# Patient Record
Sex: Female | Born: 1985 | Race: Black or African American | Hispanic: No | Marital: Single | Smoking: Former smoker
Health system: Southern US, Community
[De-identification: ages and names within clinical notes are randomized; demographics above are authoritative.]

## PROBLEM LIST (undated history)

## (undated) ENCOUNTER — Inpatient Hospital Stay (HOSPITAL_COMMUNITY): Payer: Self-pay

## (undated) DIAGNOSIS — R011 Cardiac murmur, unspecified: Secondary | ICD-10-CM

## (undated) DIAGNOSIS — Z8619 Personal history of other infectious and parasitic diseases: Secondary | ICD-10-CM

## (undated) HISTORY — PX: SKIN GRAFT: SHX250

## (undated) HISTORY — PX: OTHER SURGICAL HISTORY: SHX169

---
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
## 2008-02-03 ENCOUNTER — Inpatient Hospital Stay (HOSPITAL_COMMUNITY): Admission: AD | Admit: 2008-02-03 | Discharge: 2008-02-03 | Payer: Self-pay | Admitting: Obstetrics & Gynecology

## 2008-02-09 ENCOUNTER — Inpatient Hospital Stay (HOSPITAL_COMMUNITY): Admission: AD | Admit: 2008-02-09 | Discharge: 2008-02-09 | Payer: Self-pay | Admitting: Obstetrics and Gynecology

## 2008-02-12 ENCOUNTER — Inpatient Hospital Stay (HOSPITAL_COMMUNITY): Admission: AD | Admit: 2008-02-12 | Discharge: 2008-02-12 | Payer: Self-pay | Admitting: Obstetrics & Gynecology

## 2008-03-02 ENCOUNTER — Inpatient Hospital Stay (HOSPITAL_COMMUNITY): Admission: AD | Admit: 2008-03-02 | Discharge: 2008-03-02 | Payer: Self-pay | Admitting: Obstetrics & Gynecology

## 2008-03-03 ENCOUNTER — Inpatient Hospital Stay (HOSPITAL_COMMUNITY): Admission: AD | Admit: 2008-03-03 | Discharge: 2008-03-03 | Payer: Self-pay | Admitting: Family Medicine

## 2008-07-05 ENCOUNTER — Emergency Department (HOSPITAL_COMMUNITY): Admission: EM | Admit: 2008-07-05 | Discharge: 2008-07-05 | Payer: Self-pay | Admitting: Emergency Medicine

## 2009-01-20 ENCOUNTER — Emergency Department (HOSPITAL_COMMUNITY): Admission: EM | Admit: 2009-01-20 | Discharge: 2009-01-20 | Payer: Self-pay | Admitting: Emergency Medicine

## 2009-01-24 ENCOUNTER — Emergency Department (HOSPITAL_COMMUNITY): Admission: EM | Admit: 2009-01-24 | Discharge: 2009-01-24 | Payer: Self-pay | Admitting: Family Medicine

## 2009-02-14 ENCOUNTER — Ambulatory Visit: Payer: Self-pay | Admitting: Obstetrics & Gynecology

## 2009-02-14 ENCOUNTER — Encounter: Payer: Self-pay | Admitting: Obstetrics & Gynecology

## 2009-02-15 ENCOUNTER — Encounter: Payer: Self-pay | Admitting: Obstetrics & Gynecology

## 2009-02-15 LAB — CONVERTED CEMR LAB
Clue Cells Wet Prep HPF POC: NONE SEEN
Trich, Wet Prep: NONE SEEN
Yeast Wet Prep HPF POC: NONE SEEN

## 2009-07-30 ENCOUNTER — Inpatient Hospital Stay (HOSPITAL_COMMUNITY): Admission: AD | Admit: 2009-07-30 | Discharge: 2009-07-31 | Payer: Self-pay | Admitting: Obstetrics and Gynecology

## 2009-11-12 ENCOUNTER — Inpatient Hospital Stay (HOSPITAL_COMMUNITY): Admission: AD | Admit: 2009-11-12 | Discharge: 2009-11-12 | Payer: Self-pay | Admitting: Obstetrics and Gynecology

## 2009-11-12 ENCOUNTER — Inpatient Hospital Stay (HOSPITAL_COMMUNITY): Admission: AD | Admit: 2009-11-12 | Discharge: 2009-11-15 | Payer: Self-pay | Admitting: Obstetrics and Gynecology

## 2009-11-13 ENCOUNTER — Encounter (INDEPENDENT_AMBULATORY_CARE_PROVIDER_SITE_OTHER): Payer: Self-pay | Admitting: Obstetrics and Gynecology

## 2010-01-04 IMAGING — US US OB TRANSVAGINAL MODIFY
1 series · 14 of 28 positions shown · non-contrast
Comparison: none

CLINICAL DATA: 5 weeks spotting

OBSTETRIC <14 WK US AND TRANSVAGINAL OB US
TECHNIQUE: Both transabdominal and transvaginal ultrasound
examinations were performed for complete evaluation of the
gestation as well as the maternal uterus, adnexal regions, and
pelvic cul-de-sac.

[Series 1: us ob transvaginal modify · 0.28mm/px · 14 of 31 slices shown]
[im 2/31]
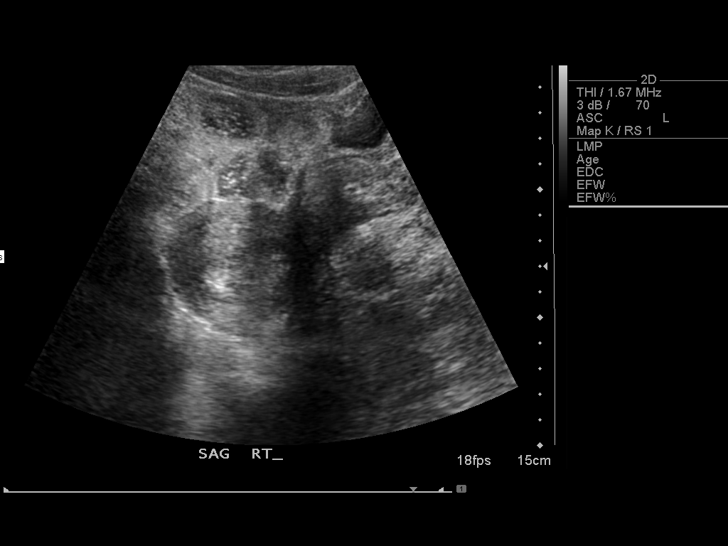
[im 4/31]
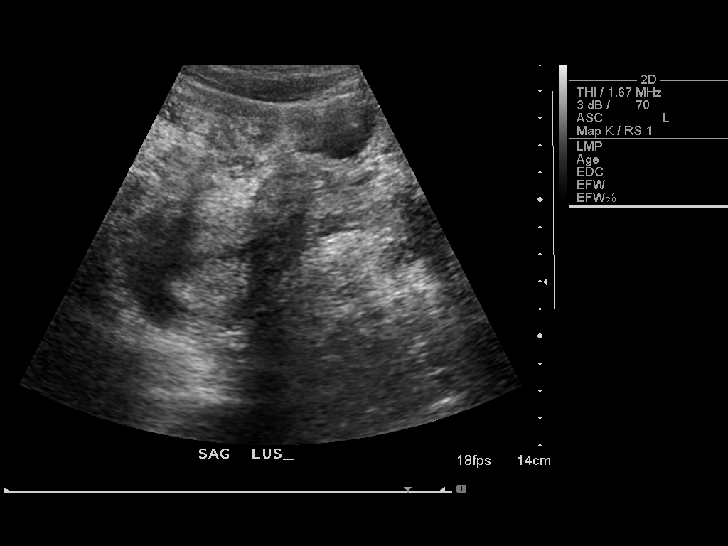
[im 6/31]
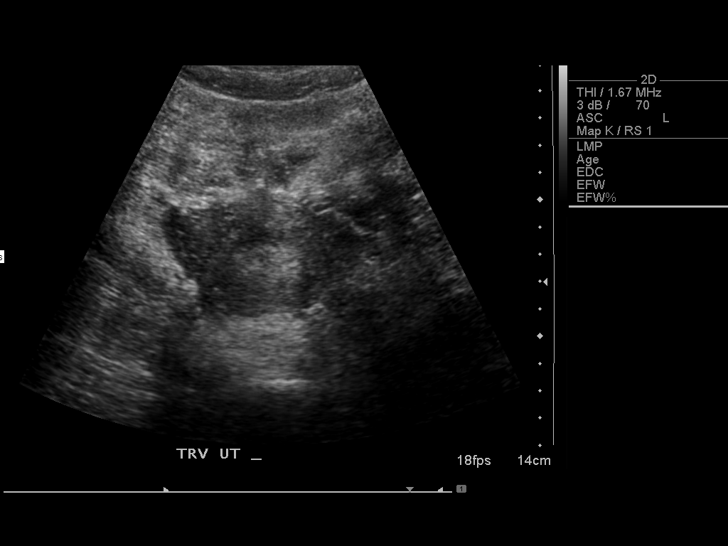
[im 8/31]
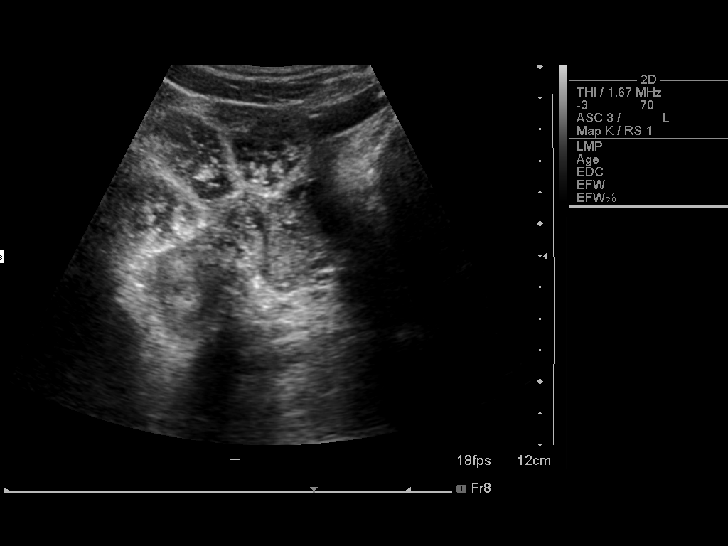
[im 11/31]
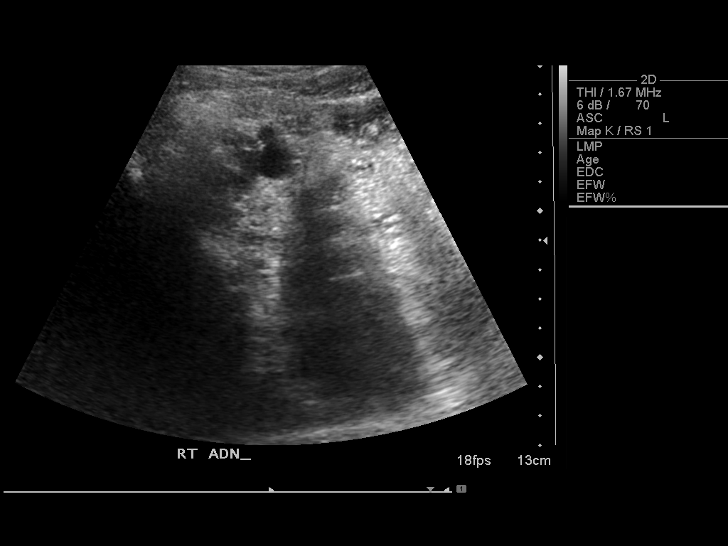
[im 13/31]
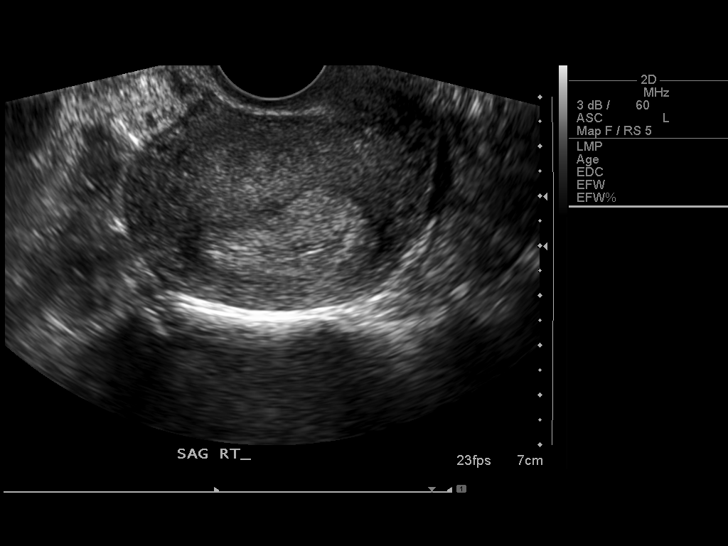
[im 15/31]
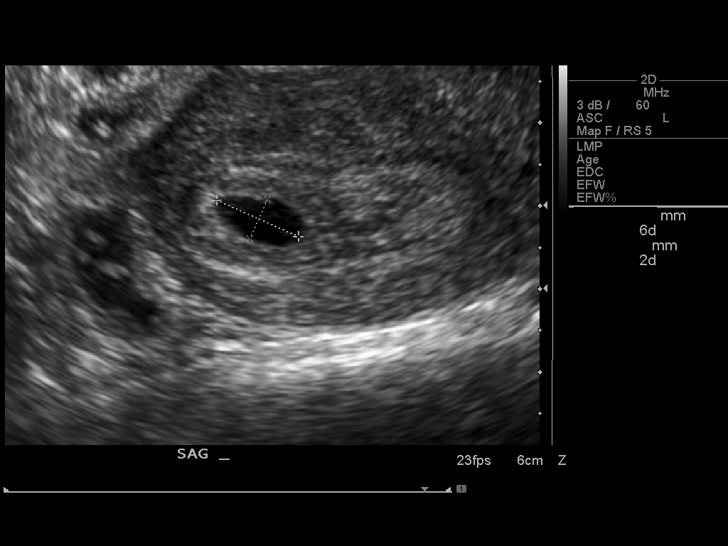
[im 17/31]
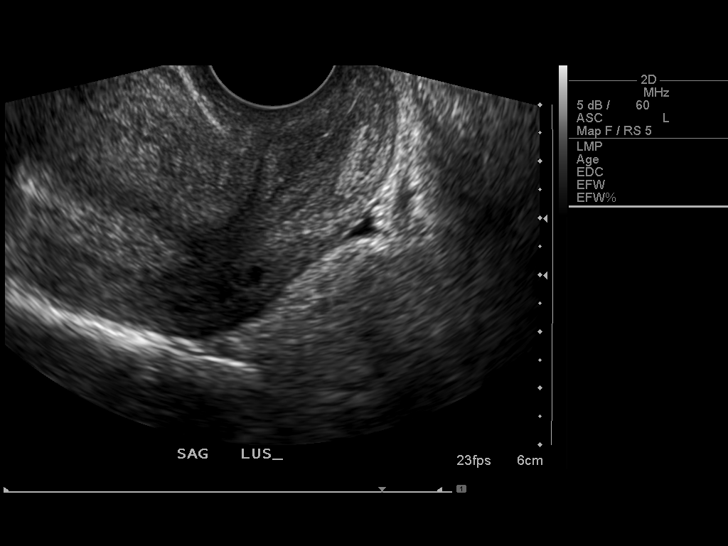
[im 19/31]
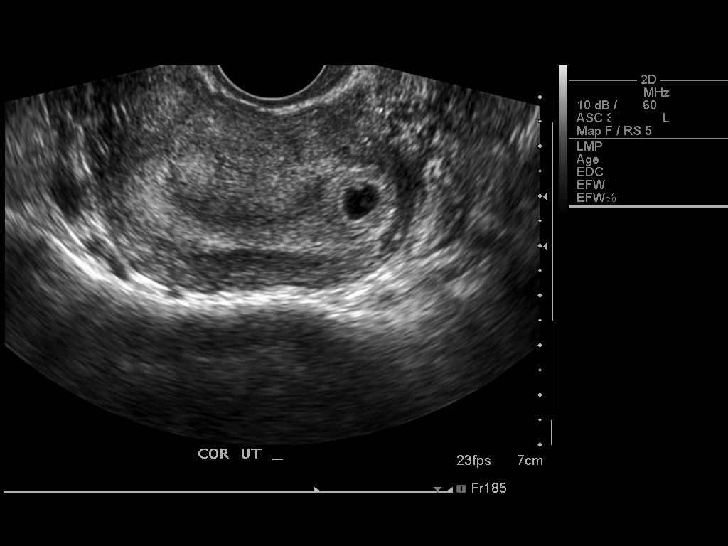
[im 22/31]
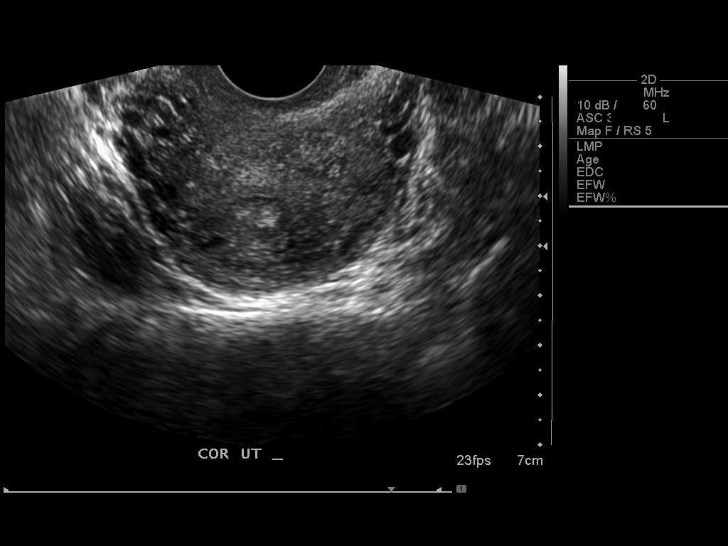
[im 24/31]
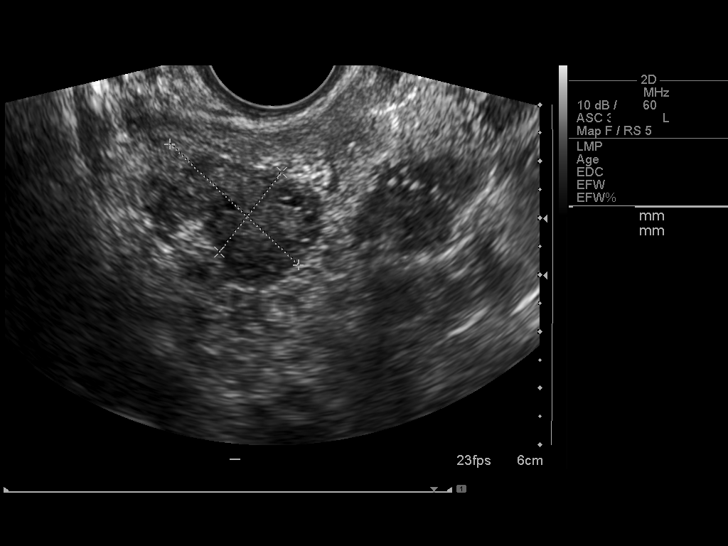
[im 26/31]
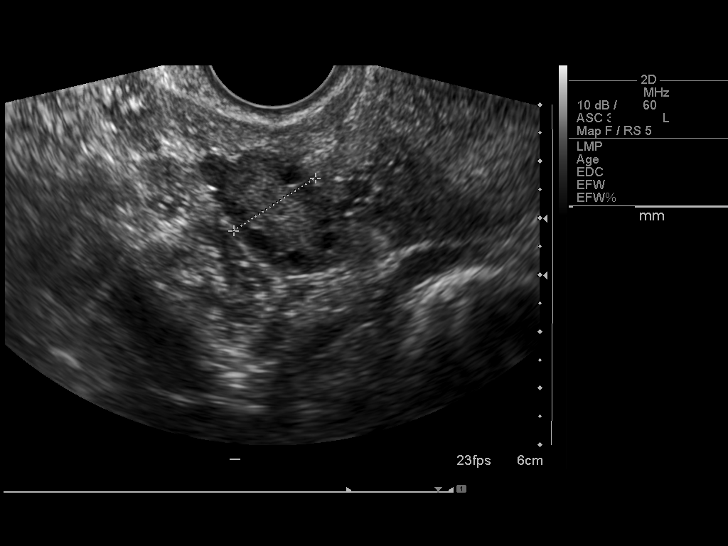
[im 28/31]
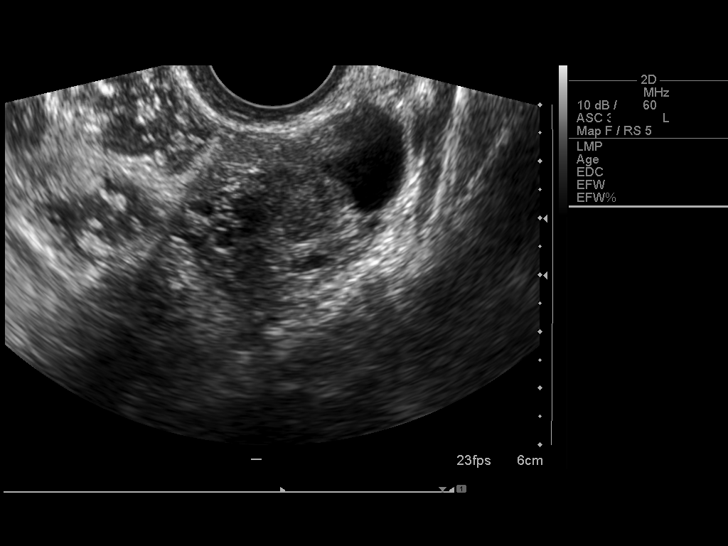
[im 31/31]
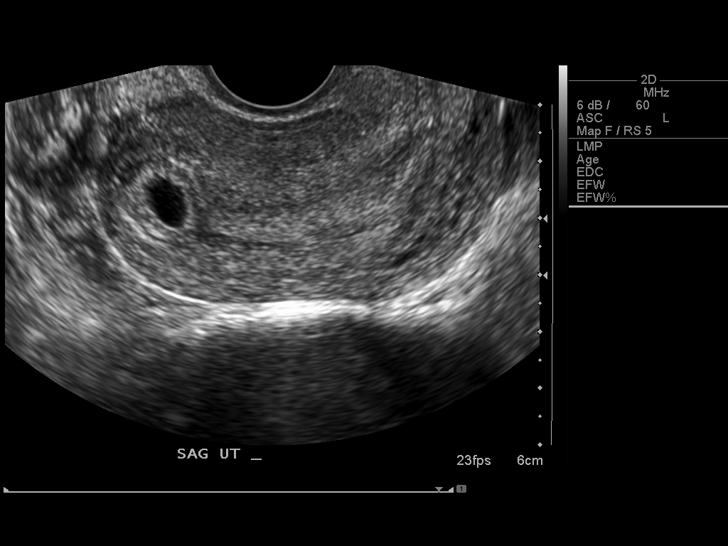

[14 of 28 positions shown; findings below may reference images not displayed]

FINDING: Single intrauterine gestational sac in the left aspect of
the uterine fundus with yolk sac present.  No embryo present.

MSD: 7.7 mm    5 weeks 4 days

Maternal uterus/adnexae:
Corpus luteal cyst in the left ovary.  Normal right ovary.  No
subchorionic hemorrhage.  No free fluid.
IMPRESSION: 1.  Single intrauterine gestational sac with yolk sac present most
consistent with early intrauterine pregnancy. Cannot completely
exclude spontaneous abortion in progress or ectopic pregnancy.
Recommend standard follow-up Beta HCG and imaging.

## 2010-09-24 ENCOUNTER — Inpatient Hospital Stay (HOSPITAL_COMMUNITY)
Admission: AD | Admit: 2010-09-24 | Discharge: 2010-09-24 | Payer: Self-pay | Source: Home / Self Care | Attending: Obstetrics and Gynecology | Admitting: Obstetrics and Gynecology

## 2010-12-23 LAB — URINALYSIS, ROUTINE W REFLEX MICROSCOPIC
Bilirubin Urine: NEGATIVE
Glucose, UA: NEGATIVE mg/dL
Hgb urine dipstick: NEGATIVE
Ketones, ur: NEGATIVE mg/dL
Specific Gravity, Urine: 1.015 (ref 1.005–1.030)
pH: 7 (ref 5.0–8.0)

## 2010-12-23 LAB — WET PREP, GENITAL: Trich, Wet Prep: NONE SEEN

## 2010-12-23 LAB — POCT PREGNANCY, URINE: Preg Test, Ur: NEGATIVE

## 2010-12-23 LAB — GC/CHLAMYDIA PROBE AMP, GENITAL
Chlamydia, DNA Probe: NEGATIVE
GC Probe Amp, Genital: NEGATIVE

## 2010-12-29 LAB — CBC
Platelets: 173 10*3/uL (ref 150–400)
RDW: 16.1 % — ABNORMAL HIGH (ref 11.5–15.5)
WBC: 11.4 10*3/uL — ABNORMAL HIGH (ref 4.0–10.5)

## 2010-12-29 LAB — RPR: RPR Ser Ql: NONREACTIVE

## 2011-01-01 LAB — CBC
Hemoglobin: 10.2 g/dL — ABNORMAL LOW (ref 12.0–15.0)
RBC: 3.32 MIL/uL — ABNORMAL LOW (ref 3.87–5.11)
WBC: 18.8 10*3/uL — ABNORMAL HIGH (ref 4.0–10.5)

## 2011-01-21 LAB — POCT PREGNANCY, URINE: Preg Test, Ur: NEGATIVE

## 2011-01-22 LAB — URINALYSIS, ROUTINE W REFLEX MICROSCOPIC
Glucose, UA: NEGATIVE mg/dL
Nitrite: POSITIVE — AB
Specific Gravity, Urine: 1.027 (ref 1.005–1.030)
pH: 7 (ref 5.0–8.0)

## 2011-01-22 LAB — URINE MICROSCOPIC-ADD ON

## 2011-04-11 ENCOUNTER — Ambulatory Visit (INDEPENDENT_AMBULATORY_CARE_PROVIDER_SITE_OTHER): Payer: Self-pay

## 2011-04-11 ENCOUNTER — Inpatient Hospital Stay (INDEPENDENT_AMBULATORY_CARE_PROVIDER_SITE_OTHER)
Admission: RE | Admit: 2011-04-11 | Discharge: 2011-04-11 | Disposition: A | Discharge status: Home / Self Care | Payer: Self-pay | Source: Ambulatory Visit | Attending: Family Medicine | Admitting: Family Medicine

## 2011-04-11 DIAGNOSIS — R071 Chest pain on breathing: Secondary | ICD-10-CM

## 2011-07-08 LAB — URINALYSIS, ROUTINE W REFLEX MICROSCOPIC
Bilirubin Urine: NEGATIVE
Glucose, UA: NEGATIVE
Glucose, UA: NEGATIVE
Hgb urine dipstick: NEGATIVE
Hgb urine dipstick: NEGATIVE
Ketones, ur: 15 — AB
Protein, ur: NEGATIVE
Protein, ur: NEGATIVE

## 2011-07-08 LAB — CBC
HCT: 37.4
Hemoglobin: 13
MCHC: 34.7
RBC: 3.97

## 2011-07-08 LAB — ABO/RH: ABO/RH(D): O POS

## 2011-07-08 LAB — WET PREP, GENITAL
Trich, Wet Prep: NONE SEEN
Yeast Wet Prep HPF POC: NONE SEEN

## 2011-07-08 LAB — POCT PREGNANCY, URINE: Preg Test, Ur: POSITIVE

## 2011-07-08 LAB — GC/CHLAMYDIA PROBE AMP, GENITAL: GC Probe Amp, Genital: NEGATIVE

## 2011-07-09 LAB — CBC
HCT: 36.6
Hemoglobin: 12.5
MCHC: 34.2
MCV: 95.4
RDW: 13.3

## 2012-01-20 DIAGNOSIS — R1013 Epigastric pain: Secondary | ICD-10-CM | POA: Insufficient documentation

## 2012-01-20 DIAGNOSIS — F17200 Nicotine dependence, unspecified, uncomplicated: Secondary | ICD-10-CM | POA: Insufficient documentation

## 2012-01-20 DIAGNOSIS — K219 Gastro-esophageal reflux disease without esophagitis: Secondary | ICD-10-CM | POA: Insufficient documentation

## 2012-01-20 DIAGNOSIS — R1110 Vomiting, unspecified: Secondary | ICD-10-CM | POA: Insufficient documentation

## 2012-01-21 ENCOUNTER — Encounter (HOSPITAL_COMMUNITY): Payer: Self-pay | Admitting: Emergency Medicine

## 2012-01-21 ENCOUNTER — Emergency Department (HOSPITAL_COMMUNITY)
Admission: EM | Admit: 2012-01-21 | Discharge: 2012-01-21 | Disposition: A | Discharge status: Home / Self Care | Payer: Self-pay | Attending: Emergency Medicine | Admitting: Emergency Medicine

## 2012-01-21 DIAGNOSIS — K219 Gastro-esophageal reflux disease without esophagitis: Secondary | ICD-10-CM

## 2012-01-21 LAB — COMPREHENSIVE METABOLIC PANEL
Alkaline Phosphatase: 34 U/L — ABNORMAL LOW (ref 39–117)
BUN: 12 mg/dL (ref 6–23)
Calcium: 9 mg/dL (ref 8.4–10.5)
GFR calc Af Amer: >90 mL/min (ref >90)
Glucose, Bld: 77 mg/dL (ref 70–99)
Total Protein: 7 g/dL (ref 6.0–8.3)

## 2012-01-21 LAB — PREGNANCY, URINE: Preg Test, Ur: NEGATIVE

## 2012-01-21 MED ORDER — LANSOPRAZOLE 15 MG PO CPDR: 15.0000 mg | DELAYED_RELEASE_CAPSULE | Freq: Every day | ORAL | Status: DC

## 2012-01-21 MED ORDER — FAMOTIDINE 20 MG PO TABS: 20.0000 mg | ORAL_TABLET | Freq: Two times a day (BID) | ORAL | Status: DC

## 2012-01-21 NOTE — Discharge Instructions (Signed)
 RESOURCE GUIDE  Dental Problems  Patients with Medicaid: [facility]                     [facility] [address]                                           [address] Phone:  [phone]                                                  Phone:  [phone]  If unable to pay or uninsured, contact:  [facility] or [facility] to become qualified for the adult dental clinic.  Chronic Pain Problems Contact [facility]  [phone] Patients need to be referred by their primary care doctor.  Insufficient Money for Medicine Contact [employer]:  call "211" or [facility] 271-5999.  No Primary Care Doctor Call [facility]  [phone] Other agencies that provide inexpensive medical care    [facility]  [phone]    [facility]  [phone]    [facility]  [phone]    [facility]  [phone]    [facility]  [phone]    [facility]  [phone]  Psychological Services [facility]  [phone] [facility]  [phone] [facility]   [phone] (emergency services [phone])  Substance Abuse Resources [facility]  336-882-2125 [facility] 336-784-9470 [facility] 336-285-9073 [facility] 336-845-3988 Residential & Outpatient Substance Abuse Program  800-659-3381  Abuse/Neglect [place] Child Abuse Hotline (336) 641-3795 [place] [facility] Abuse Hotline 800-378-5315 (After Hours)  Emergency Shelter [facility] (336) 271-5985  [facility] (336) 275-9566 [facility] (704) 372-4663  MRSA Hotline #:   832-7006    [place] [facility]  [facility] [place]     [employer]                          [facility] [address] [place]                       [address]      [address]  [place]                                                [place]                            [place] Phone:  [phone]                                   Phone:  [phone]                 Phone:  [phone]  [facility] Phone:    [phone]  [place] Child Abuse Hotline (336) 342-1394 (336) 342-3537 (After Hours)   
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
## 2012-01-21 NOTE — ED Notes (Signed)
Pt states she has been having abd pain for the past several months  Pt states she has been vomiting since Sunday  Pt states her stomach feels tight and bloated and has burning  Pt states everytime she eats or drinks anything she feels nauseated and sick on her stomach  Pt states she has regular bowel movements sometimes two to three a day  Pt states she has used OTC medication like prilosec and tums don't help

## 2012-06-21 ENCOUNTER — Telehealth: Payer: Self-pay | Admitting: Obstetrics and Gynecology

## 2012-06-21 NOTE — Telephone Encounter (Signed)
TC continued. Desires eval States is now able to feel the Implanon which could not do previously. To accommodate pt's schedule appt scheduled with EP 07/02/12. Suggested pt do UPT to R/O pregnancy. To call if results +throat culture TCTC  Pt transferred to T. Clelia Croft to discuss visit charges. Per TS, pt stated was not able to afford at this time but D/c"d phone call prior to ascertaining if wanted to cancel appt. TC to pt. Left VM to call to cancel appt if desires. Mentioned charge if not cancelled within 24 hours of appt.

## 2012-06-21 NOTE — Telephone Encounter (Signed)
TC to pt. States x 3 weeks is having nausea, dizziness and fatigue. 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  Denies any medical problems and does not have PCP.

## 2012-06-21 NOTE — Telephone Encounter (Signed)
Rhonda Blanchard/ADDRESSED ISSUE/SIGNED

## 2012-07-02 ENCOUNTER — Encounter: Payer: Self-pay | Admitting: Obstetrics and Gynecology

## 2013-03-24 ENCOUNTER — Emergency Department (HOSPITAL_COMMUNITY): Payer: Self-pay

## 2013-03-24 ENCOUNTER — Emergency Department (HOSPITAL_COMMUNITY)
Admission: EM | Admit: 2013-03-24 | Discharge: 2013-03-24 | Disposition: A | Discharge status: Home / Self Care | Payer: Self-pay | Attending: Emergency Medicine | Admitting: Emergency Medicine

## 2013-03-24 ENCOUNTER — Encounter (HOSPITAL_COMMUNITY): Payer: Self-pay | Admitting: Emergency Medicine

## 2013-03-24 DIAGNOSIS — R109 Unspecified abdominal pain: Secondary | ICD-10-CM | POA: Insufficient documentation

## 2013-03-24 DIAGNOSIS — Z3202 Encounter for pregnancy test, result negative: Secondary | ICD-10-CM | POA: Insufficient documentation

## 2013-03-24 DIAGNOSIS — F17200 Nicotine dependence, unspecified, uncomplicated: Secondary | ICD-10-CM | POA: Insufficient documentation

## 2013-03-24 DIAGNOSIS — R197 Diarrhea, unspecified: Secondary | ICD-10-CM | POA: Insufficient documentation

## 2013-03-24 LAB — CBC WITH DIFFERENTIAL/PLATELET
Basophils Absolute: 0 10*3/uL (ref 0.0–0.1)
Eosinophils Relative: 2 % (ref 0–5)
Lymphocytes Relative: 33 % (ref 12–46)
Lymphs Abs: 1.8 10*3/uL (ref 0.7–4.0)
MCV: 89.6 fL (ref 78.0–100.0)
Neutro Abs: 3 10*3/uL (ref 1.7–7.7)
Platelets: 187 10*3/uL (ref 150–400)
RBC: 3.95 MIL/uL (ref 3.87–5.11)
RDW: 13.3 % (ref 11.5–15.5)
WBC: 5.4 10*3/uL (ref 4.0–10.5)

## 2013-03-24 LAB — COMPREHENSIVE METABOLIC PANEL
ALT: 18 U/L (ref 0–35)
AST: 27 U/L (ref 0–37)
Alkaline Phosphatase: 29 U/L — ABNORMAL LOW (ref 39–117)
CO2: 23 mEq/L (ref 19–32)
Calcium: 9.1 mg/dL (ref 8.4–10.5)
Chloride: 103 mEq/L (ref 96–112)
GFR calc Af Amer: >90 mL/min (ref >90)
GFR calc non Af Amer: >90 mL/min (ref >90)
Glucose, Bld: 89 mg/dL (ref 70–99)
Potassium: 4.4 mEq/L (ref 3.5–5.1)
Sodium: 136 mEq/L (ref 135–145)
Total Bilirubin: 0.6 mg/dL (ref 0.3–1.2)

## 2013-03-24 MED ORDER — DICYCLOMINE HCL 20 MG PO TABS: ORAL_TABLET | ORAL | Status: DC

## 2013-03-24 MED ORDER — ONDANSETRON HCL 4 MG/2ML IJ SOLN
4.0000 mg | Freq: Once | INTRAMUSCULAR | Status: AC
  Administered 2013-03-24: 4 mg via INTRAVENOUS
  Filled 2013-03-24: qty 2

## 2013-03-24 MED ORDER — SODIUM CHLORIDE 0.9 % IV BOLUS (SEPSIS)
1000.0000 mL | Freq: Once | INTRAVENOUS | Status: AC
  Administered 2013-03-24: 1000 mL via INTRAVENOUS

## 2013-03-24 NOTE — Progress Notes (Signed)
[facility] has seen patient and provided her with pc resources. °

## 2013-03-24 NOTE — ED Notes (Signed)
Patient transported to X-ray 

## 2013-03-24 NOTE — ED Notes (Signed)
States that she has been experiencing diarrhea for the past two weeks. States that she feels a burning sensation in her abd. Denies blood in stools. States that she has had this feeling before and was seen in the ER. States that she was given meds with no resolve of symptoms. States that her complaint is constant. States that her symptoms began 3 years ago after having her child.
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
## 2014-01-20 ENCOUNTER — Inpatient Hospital Stay (HOSPITAL_COMMUNITY)
Admission: AD | Admit: 2014-01-20 | Discharge: 2014-01-20 | Disposition: A | Discharge status: Home / Self Care | Payer: Self-pay | Source: Ambulatory Visit | Attending: Family Medicine | Admitting: Family Medicine

## 2014-01-20 ENCOUNTER — Encounter (HOSPITAL_COMMUNITY): Payer: Self-pay | Admitting: *Deleted

## 2014-01-20 DIAGNOSIS — N764 Abscess of vulva: Secondary | ICD-10-CM | POA: Insufficient documentation

## 2014-01-20 DIAGNOSIS — Z87891 Personal history of nicotine dependence: Secondary | ICD-10-CM | POA: Insufficient documentation

## 2014-01-20 MED ORDER — OXYCODONE-ACETAMINOPHEN 5-325 MG PO TABS
1.0000 | ORAL_TABLET | Freq: Once | ORAL | Status: AC
  Administered 2014-01-20: 1 via ORAL
  Filled 2014-01-20: qty 1

## 2014-01-20 MED ORDER — LIDOCAINE HCL (PF) 1 % IJ SOLN
INTRAMUSCULAR | Status: AC
  Filled 2014-01-20: qty 30

## 2014-01-20 MED ORDER — SULFAMETHOXAZOLE-TRIMETHOPRIM 800-160 MG PO TABS: 1.0000 | ORAL_TABLET | Freq: Two times a day (BID) | ORAL | Status: AC

## 2014-01-20 MED ORDER — HYDROCODONE-ACETAMINOPHEN 5-325 MG PO TABS: 1.0000 | ORAL_TABLET | Freq: Four times a day (QID) | ORAL | Status: DC | PRN

## 2014-01-20 MED ORDER — IBUPROFEN 600 MG PO TABS: 600.0000 mg | ORAL_TABLET | Freq: Four times a day (QID) | ORAL | Status: DC | PRN

## 2014-01-20 MED ORDER — LIDOCAINE HCL 2 % EX GEL
Freq: Once | CUTANEOUS | Status: AC
  Administered 2014-01-20: 5 via TOPICAL
  Filled 2014-01-20: qty 5

## 2014-01-20 NOTE — MAU Provider Note (Signed)
Attestation of Attending Supervision of Advanced Practitioner (PA/CNM/NP): Evaluation and management procedures were performed by the Advanced Practitioner under my supervision and collaboration.  I have reviewed the Advanced Practitioner's note and chart, and I agree with the management and plan.  Luby Seamans, DO Attending Physician Faculty Practice, [facility]  
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
## 2014-01-20 NOTE — Discharge Instructions (Signed)
Continue to sit in warm tubs of water. Do not take the narcotic if you are driving as it will make you sleepy.  Return for any problems.

## 2014-01-20 NOTE — MAU Note (Signed)
Patient states she has had a "boil" in the pantie line of the left groin for 3 days. Painful. Denies bleeding or discharge.

## 2014-05-29 ENCOUNTER — Encounter (HOSPITAL_COMMUNITY): Payer: Self-pay | Admitting: Emergency Medicine

## 2014-05-29 ENCOUNTER — Emergency Department (HOSPITAL_COMMUNITY)
Admission: EM | Admit: 2014-05-29 | Discharge: 2014-05-29 | Disposition: A | Discharge status: Home / Self Care | Payer: Self-pay | Attending: Emergency Medicine | Admitting: Emergency Medicine

## 2014-05-29 DIAGNOSIS — N764 Abscess of vulva: Secondary | ICD-10-CM | POA: Insufficient documentation

## 2014-05-29 DIAGNOSIS — L089 Local infection of the skin and subcutaneous tissue, unspecified: Secondary | ICD-10-CM | POA: Insufficient documentation

## 2014-05-29 DIAGNOSIS — Z87891 Personal history of nicotine dependence: Secondary | ICD-10-CM | POA: Insufficient documentation

## 2014-05-29 MED ORDER — IBUPROFEN 200 MG PO TABS
600.0000 mg | ORAL_TABLET | Freq: Once | ORAL | Status: AC
  Administered 2014-05-29: 600 mg via ORAL
  Filled 2014-05-29: qty 3

## 2014-05-29 MED ORDER — SULFAMETHOXAZOLE-TRIMETHOPRIM 800-160 MG PO TABS: 1.0000 | ORAL_TABLET | Freq: Two times a day (BID) | ORAL | Status: DC

## 2014-05-29 MED ORDER — OXYCODONE-ACETAMINOPHEN 5-325 MG PO TABS
2.0000 | ORAL_TABLET | Freq: Once | ORAL | Status: AC
  Administered 2014-05-29: 2 via ORAL
  Filled 2014-05-29: qty 2

## 2014-05-29 MED ORDER — OXYCODONE-ACETAMINOPHEN 5-325 MG PO TABS: 1.0000 | ORAL_TABLET | ORAL | Status: DC | PRN

## 2014-05-29 NOTE — Discharge Instructions (Signed)

## 2014-05-29 NOTE — ED Notes (Signed)
Patient presents with abscess on left side of groin with pain 10/10. Patient denies drainage, N/V, fever and chills at this time.

## 2014-05-29 NOTE — ED Notes (Signed)
Pt states that has boil on left groin area for past 3 days. Pt denies any drainage. And very painful.
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
## 2014-08-08 ENCOUNTER — Emergency Department (HOSPITAL_COMMUNITY)
Admission: EM | Admit: 2014-08-08 | Discharge: 2014-08-08 | Disposition: A | Discharge status: Home / Self Care | Payer: Self-pay | Attending: Emergency Medicine | Admitting: Emergency Medicine

## 2014-08-08 ENCOUNTER — Encounter (HOSPITAL_COMMUNITY): Payer: Self-pay | Admitting: Emergency Medicine

## 2014-08-08 DIAGNOSIS — K219 Gastro-esophageal reflux disease without esophagitis: Secondary | ICD-10-CM | POA: Insufficient documentation

## 2014-08-08 DIAGNOSIS — Z87891 Personal history of nicotine dependence: Secondary | ICD-10-CM | POA: Insufficient documentation

## 2014-08-08 DIAGNOSIS — K2970 Gastritis, unspecified, without bleeding: Secondary | ICD-10-CM | POA: Insufficient documentation

## 2014-08-08 DIAGNOSIS — Z79899 Other long term (current) drug therapy: Secondary | ICD-10-CM | POA: Insufficient documentation

## 2014-08-08 LAB — CBC WITH DIFFERENTIAL/PLATELET
BASOS ABS: 0 10*3/uL (ref 0.0–0.1)
Basophils Relative: 0 % (ref 0–1)
Eosinophils Absolute: 0.1 10*3/uL (ref 0.0–0.7)
Eosinophils Relative: 1 % (ref 0–5)
HEMATOCRIT: 34.9 % — AB (ref 36.0–46.0)
HEMOGLOBIN: 12.1 g/dL (ref 12.0–15.0)
LYMPHS PCT: 48 % — AB (ref 12–46)
Lymphs Abs: 2.9 10*3/uL (ref 0.7–4.0)
MCH: 31.8 pg (ref 26.0–34.0)
MCHC: 34.7 g/dL (ref 30.0–36.0)
MCV: 91.6 fL (ref 78.0–100.0)
MONO ABS: 0.4 10*3/uL (ref 0.1–1.0)
MONOS PCT: 7 % (ref 3–12)
NEUTROS ABS: 2.6 10*3/uL (ref 1.7–7.7)
Neutrophils Relative %: 44 % (ref 43–77)
Platelets: 186 10*3/uL (ref 150–400)
RBC: 3.81 MIL/uL — ABNORMAL LOW (ref 3.87–5.11)
RDW: 13 % (ref 11.5–15.5)
WBC: 5.9 10*3/uL (ref 4.0–10.5)

## 2014-08-08 LAB — COMPREHENSIVE METABOLIC PANEL
ALT: 17 U/L (ref 0–35)
ANION GAP: 12 (ref 5–15)
AST: 14 U/L (ref 0–37)
Albumin: 4 g/dL (ref 3.5–5.2)
Alkaline Phosphatase: 33 U/L — ABNORMAL LOW (ref 39–117)
BUN: 9 mg/dL (ref 6–23)
CHLORIDE: 106 meq/L (ref 96–112)
CO2: 27 meq/L (ref 19–32)
CREATININE: 0.72 mg/dL (ref 0.50–1.10)
Calcium: 9.2 mg/dL (ref 8.4–10.5)
GFR calc non Af Amer: >90 mL/min (ref >90)
Glucose, Bld: 93 mg/dL (ref 70–99)
Potassium: 3.4 mEq/L — ABNORMAL LOW (ref 3.7–5.3)
Sodium: 145 mEq/L (ref 137–147)
Total Bilirubin: 0.3 mg/dL (ref 0.3–1.2)
Total Protein: 7.2 g/dL (ref 6.0–8.3)

## 2014-08-08 LAB — URINE MICROSCOPIC-ADD ON

## 2014-08-08 LAB — URINALYSIS, ROUTINE W REFLEX MICROSCOPIC
Bilirubin Urine: NEGATIVE
Glucose, UA: NEGATIVE mg/dL
Ketones, ur: NEGATIVE mg/dL
Leukocytes, UA: NEGATIVE
NITRITE: NEGATIVE
PH: 6.5 (ref 5.0–8.0)
Protein, ur: NEGATIVE mg/dL
SPECIFIC GRAVITY, URINE: 1.028 (ref 1.005–1.030)
UROBILINOGEN UA: 1 mg/dL (ref 0.0–1.0)

## 2014-08-08 LAB — LIPASE, BLOOD: LIPASE: 29 U/L (ref 11–59)

## 2014-08-08 MED ORDER — ONDANSETRON 4 MG PO TBDP: ORAL_TABLET | ORAL | Status: DC

## 2014-08-08 MED ORDER — OMEPRAZOLE 20 MG PO CPDR: 20.0000 mg | DELAYED_RELEASE_CAPSULE | Freq: Every day | ORAL | Status: DC

## 2014-08-08 MED ORDER — PANTOPRAZOLE SODIUM 40 MG PO TBEC
40.0000 mg | DELAYED_RELEASE_TABLET | Freq: Once | ORAL | Status: AC
  Administered 2014-08-08: 40 mg via ORAL
  Filled 2014-08-08: qty 1

## 2014-08-08 MED ORDER — GI COCKTAIL ~~LOC~~
30.0000 mL | Freq: Once | ORAL | Status: AC
  Administered 2014-08-08: 30 mL via ORAL
  Filled 2014-08-08: qty 30

## 2014-08-08 NOTE — Progress Notes (Signed)
EDCM spoke to patient at bedside. Patient confirms she does not have a pcp or insurance living in [place].  EDCM provide patient with pamphlet to [facility], informed patient of services there and walk in times.  EDCM also provided patient with list of pcps who accept self pay patients, list of discount pharmacies and websites needymeds.org and GoodRX.com for medication assistance, phone number to inquire about the orange card, phone number to inquire about Mediciad, phone number to inquire about the Affordable Care Act, financial resources in the community such as local churches, salvation army, urban ministries, and dental assistance for uninsured patients.  Patient thankfulf or resources.  No further EDCM needs at this time. 

## 2014-08-08 NOTE — Discharge Instructions (Signed)
1. Medications: omeprazole, zofran, usual home medications 2. Treatment: rest, drink plenty of fluids,  3. Follow Up: Please followup with your primary doctor in 3 days for discussion of your diagnoses and further evaluation after today's visit; if you do not have a primary care doctor use the resource guide provided to find one; Please return to the ER for intractable vomiting, worsening abdominal pain, high fevers or other concerning symptoms  Gastritis, Adult Gastritis is soreness and swelling (inflammation) of the lining of the stomach. Gastritis can develop as a sudden onset (acute) or long-term (chronic) condition. If gastritis is not treated, it can lead to stomach bleeding and ulcers. CAUSES  Gastritis occurs when the stomach lining is weak or damaged. Digestive juices from the stomach then inflame the weakened stomach lining. The stomach lining may be weak or damaged due to viral or bacterial infections. One common bacterial infection is the Helicobacter pylori infection. Gastritis can also result from excessive alcohol consumption, taking certain medicines, or having too much acid in the stomach.  SYMPTOMS  In some cases, there are no symptoms. When symptoms are present, they may include:  Pain or a burning sensation in the upper abdomen.  Nausea.  Vomiting.  An uncomfortable feeling of fullness after eating. DIAGNOSIS  Your caregiver may suspect you have gastritis based on your symptoms and a physical exam. To determine the cause of your gastritis, your caregiver may perform the following:  Blood or stool tests to check for the H pylori bacterium.  Gastroscopy. A thin, flexible tube (endoscope) is passed down the esophagus and into the stomach. The endoscope has a light and camera on the end. Your caregiver uses the endoscope to view the inside of the stomach.  Taking a tissue sample (biopsy) from the stomach to examine under a microscope. TREATMENT  Depending on the cause of  your gastritis, medicines may be prescribed. If you have a bacterial infection, such as an H pylori infection, antibiotics may be given. If your gastritis is caused by too much acid in the stomach, H2 blockers or antacids may be given. Your caregiver may recommend that you stop taking aspirin, ibuprofen, or other nonsteroidal anti-inflammatory drugs (NSAIDs). HOME CARE INSTRUCTIONS  Only take over-the-counter or prescription medicines as directed by your caregiver.  If you were given antibiotic medicines, take them as directed. Finish them even if you start to feel better.  Drink enough fluids to keep your urine clear or pale yellow.  Avoid foods and drinks that make your symptoms worse, such as:  Caffeine or alcoholic drinks.  Chocolate.  Peppermint or mint flavorings.  Garlic and onions.  Spicy foods.  Citrus fruits, such as oranges, lemons, or limes.  Tomato-based foods such as sauce, chili, salsa, and pizza.  Fried and fatty foods.  Eat small, frequent meals instead of large meals. SEEK IMMEDIATE MEDICAL CARE IF:   You have black or dark red stools.  You vomit blood or material that looks like coffee grounds.  You are unable to keep fluids down.  Your abdominal pain gets worse.  You have a fever.  You do not feel better after 1 week.  You have any other questions or concerns. MAKE SURE YOU:  Understand these instructions.  Will watch your condition.  Will get help right away if you are not doing well or get worse. Document Released: 09/23/2001 Document Revised: 03/30/2012 Document Reviewed: 11/12/2011 [facility] Patient Information 2015 [employer] [place]. This information is not intended to replace advice given to you  by your health care provider. Make sure you discuss any questions you have with your health care provider.

## 2014-08-08 NOTE — ED Notes (Signed)
Pt reports abdominal pain and nausea since for several years. Pt says that "sometimes it's just worse than others", started getting worse last night. Has taken zantac without relief.

## 2014-08-08 NOTE — Progress Notes (Signed)
  CARE MANAGEMENT ED NOTE 08/08/2014  Patient:  Rhonda Blanchard,Rhonda Blanchard   Account Number:  [id number]  Date Initiated:  08/08/2014  Documentation initiated by:  Radford PaxFERRERO,Cashmere Harmes  Subjective/Objective Assessment:   Patient presents to Ed with abdominal pain and nausea for several years     Subjective/Objective Assessment Detail:     Action/Plan:   Action/Plan Detail:   Anticipated DC Date:       Status Recommendation to Physician:   Result of Recommendation:    Other ED Services  Consult Working Plan    DC Planning Services  Other  PCP issues    Choice offered to / List presented to:            Status of service:  Completed, signed off  ED Comments:   ED Comments Detail:  EDCM spoke to patient at bedside. Patient confirms she does not have a pcp [place]. EDCM [facility], informed patient of services there and walk in times.  EDCM also provided patient with list of pcps who accept self pay patients, list of discount pharmacies and websites needymeds.org and GoodRX.com for medication assistance, phone number to inquire about the orange card, phone number to inquire about Mediciad, phone number to inquire about the Affordable Care Act, financial resources in the community such as local churches, salvation army, urban ministries, and dental assistance for uninsured patients. Patient thankfulf or resources.  No further EDCM needs at this time.
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
## 2014-08-09 NOTE — ED Provider Notes (Signed)
Medical screening examination/treatment/procedure(s) were performed by non-physician practitioner and as supervising physician I was immediately available for consultation/collaboration.   EKG Interpretation None       Arby BarretteMarcy Akshay Spang, MD 08/09/14 2333

## 2014-08-14 ENCOUNTER — Encounter (HOSPITAL_COMMUNITY): Payer: Self-pay | Admitting: Emergency Medicine

## 2014-08-28 ENCOUNTER — Encounter (HOSPITAL_COMMUNITY): Payer: Self-pay | Admitting: Emergency Medicine

## 2014-08-28 ENCOUNTER — Emergency Department (HOSPITAL_COMMUNITY)
Admission: EM | Admit: 2014-08-28 | Discharge: 2014-08-28 | Disposition: A | Discharge status: Home / Self Care | Payer: Self-pay | Attending: Emergency Medicine | Admitting: Emergency Medicine

## 2014-08-28 DIAGNOSIS — Z87891 Personal history of nicotine dependence: Secondary | ICD-10-CM | POA: Insufficient documentation

## 2014-08-28 DIAGNOSIS — Z79899 Other long term (current) drug therapy: Secondary | ICD-10-CM | POA: Insufficient documentation

## 2014-08-28 DIAGNOSIS — L0231 Cutaneous abscess of buttock: Secondary | ICD-10-CM | POA: Insufficient documentation

## 2014-08-28 MED ORDER — ONDANSETRON 8 MG PO TBDP
8.0000 mg | ORAL_TABLET | Freq: Once | ORAL | Status: AC
  Administered 2014-08-28: 8 mg via ORAL
  Filled 2014-08-28: qty 1

## 2014-08-28 MED ORDER — IBUPROFEN 800 MG PO TABS: 800.0000 mg | ORAL_TABLET | Freq: Three times a day (TID) | ORAL | Status: DC | PRN

## 2014-08-28 MED ORDER — HYDROMORPHONE HCL 2 MG/ML IJ SOLN
2.0000 mg | Freq: Once | INTRAMUSCULAR | Status: AC
  Administered 2014-08-28: 2 mg via INTRAMUSCULAR
  Filled 2014-08-28: qty 1

## 2014-08-28 MED ORDER — LIDOCAINE-EPINEPHRINE (PF) 2 %-1:200000 IJ SOLN
20.0000 mL | Freq: Once | INTRAMUSCULAR | Status: AC
  Administered 2014-08-28: 20 mL
  Filled 2014-08-28: qty 20
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
## 2014-08-28 NOTE — ED Notes (Signed)
Pt anxious 

## 2014-08-28 NOTE — ED Notes (Addendum)
Pt ambulated to lobby. She reports she is leaving with ALL belongings she arrived with.

## 2014-08-28 NOTE — ED Notes (Signed)
Pt had episode of vomiting.

## 2014-08-28 NOTE — ED Notes (Signed)
Pt presents with boil to  mid of left buttock x 4 days.

## 2014-08-28 NOTE — Discharge Instructions (Signed)
Read the information below.  Use the prescribed medication as directed.  Please discuss all new medications with your pharmacist.  You may return to the Emergency Department at any time for worsening condition or any new symptoms that concern you.  If there is any possibility that you might be pregnant, please let your health care provider know and discuss this with the pharmacist to ensure medication safety.  If you develop redness, swelling, pus draining from the wound, or fevers greater than 100.4, return to the ER immediately for a recheck.   Abscess An abscess (boil or furuncle) is an infected area on or under the skin. This area is filled with yellowish-white fluid (pus) and other material (debris). HOME CARE   Only take medicines as told by your doctor.  If you were given antibiotic medicine, take it as directed. Finish the medicine even if you start to feel better.  If gauze is used, follow your doctor's directions for changing the gauze.  To avoid spreading the infection:  Keep your abscess covered with a bandage.  Wash your hands well.  Do not share personal care items, towels, or whirlpools with others.  Avoid skin contact with others.  Keep your skin and clothes clean around the abscess.  Keep all doctor visits as told. GET HELP RIGHT AWAY IF:   You have more pain, puffiness (swelling), or redness in the wound site.  You have more fluid or blood coming from the wound site.  You have muscle aches, chills, or you feel sick.  You have a fever. MAKE SURE YOU:   Understand these instructions.  Will watch your condition.  Will get help right away if you are not doing well or get worse. Document Released: 03/17/2008 Document Revised: 03/30/2012 Document Reviewed: 12/12/2011 [facility] Patient Information 2015 [place], [place]. This information is not intended to replace advice given to you by your health care provider. Make sure you discuss any questions you have with your  health care provider.    Emergency Department Resource Guide 1) Find a Doctor and Pay Out of Pocket Although you won't have to find out who is covered by your insurance plan, it is a good idea to ask around and get recommendations. You will then need to call the office and see if the doctor you have chosen will accept you as a new patient and what types of options they offer for patients who are self-pay. Some doctors offer discounts or will set up payment plans for their patients who do not have insurance, but you will need to ask so you aren't surprised when you get to your appointment.  2) Contact Your Local Health Department Not all health departments have doctors that can see patients for sick visits, but many do, so it is worth a call to see if yours does. If you don't know where your local health department is, you can check in your phone book. The CDC also has a tool to help you locate your state's health department, and many state websites also have listings of all of their local health departments.  3) Find a Walk-in Clinic If your illness is not likely to be very severe or complicated, you may want to try a walk in clinic. These are popping up all over the country in pharmacies, drugstores, and shopping centers. They're usually staffed by nurse practitioners or physician assistants that have been trained to treat common illnesses and complaints. They're usually fairly quick and inexpensive. However, if you have  serious medical issues or chronic medical problems, these are probably not your best option. ° °No Primary Care Doctor: °- Call Health Connect at  832-8000 - they can help you locate a primary care doctor that  accepts your insurance, provides certain services, etc. °- Physician Referral Service- 1-800-533-3463 ° °Chronic Pain Problems: °Organization         Address  Phone   Notes  °[facility]  (336) 297-2271 Patients need to be referred by their primary care  doctor.  ° °Medication Assistance: °Organization         Address  Phone   Notes  °[place] [facility] [address] °[place], [place] [address] (336) 641-8030 --Must be a resident of [place] °-- Must have NO insurance coverage whatsoever (no Medicaid/ Medicare, etc.) °-- The pt. MUST have a primary care doctor that directs their care regularly and follows them in the community °  °[facility]ist  (866) 331-1348   °United Way  (888) 892-1162   ° °Agencies that provide inexpensive medical care: °Organization         Address  Phone   Notes  °[facility]  (336) 832-8035   °[facility]    (336) 832-7272   °[facility] [address] °[place], [place] [address] (336) 832-4777   °[facility] [address], °[place] (336) 271-4999   °[facility]    (336) 373-0678   °[facility]    (336) 272-1050   °[facility] ° [address], [place] Phone:  (336) 832-4444, Fax:  (336) 832-4440 Hours of Operation:  9 am - 6 pm, M-F.  Also accepts Medicaid/Medicare and self-pay.  °[facility] ° [address], [place] Phone: (336) [phone], Fax: (336) [phone]. Hours of Operation:  8:30 am - 5:30 pm, M-F.  Also accepts Medicaid and self-pay.  °[facility] [place] [address], [place] Phone: (336) 878-6027   °[facility] [address], [place], [place] (336)723-1848, Ext. 123 Mondays & Thursdays: 7-9 AM.  First 15 patients are seen on a first come, first serve basis. °  ° °Medicaid-accepting [place] Providers: ° °Organization         Address  Phone   Notes  °[facility] [address], [place] (336) [phone] Also accepts self-pay patients.  °[facility] [address], [place] ° (336) 856-9996   °[facility] [address], [place] (336) 288-8857   °[facility] [address] [place] [address], [place] (336) 299-7000   °Veita Bland [address], [place]  ° (336) 373-1557 Only accepts [place] Access Medicaid patients after they have their name applied to their card.  ° °Self-Pay (no insurance) in [place]: ° °Organization         Address  Phone   Notes  °Sickle Cell Patients, [facility] [address], [place] (336) 832-1970   °[facility] [address], [place] (336) 832-4400   °[facility] ° [address], [place] (336) 992-4800   °[facility]/Dr. Osei-Bonsu ° [address] [place] [address], [place] or [address], [place] (336) [phone] Phone number for both [place] and [place] locations   is the same.  [facility] [address], [place] [phone](336) [phone]   [facility] [place] [address] [place] [address], [place] or [address] [phone]) [phone]   [facility] [address], [place] [phone]) [phone], phone; [phone]) [phone], fax Sees patients 1st and 3rd Saturday of every month.  Must not qualify for public or private insurance (i.e. Medicaid, Medicare, [place] Health Choice, Veterans' Benefits)  Household income should be no more than 200% of the poverty level The clinic cannot treat you if you are pregnant or think you are pregnant  Sexually transmitted diseases are not treated at the clinic.    Dental Care: Organization         Address  Phone  Notes  [facility] [address], [place] [phone]) [phone] Accepts children up to age 27 who are enrolled in [place] or [place] Health Choice; pregnant women with a Medicaid card; and children who have applied for Medicaid or [place] Health Choice, but were declined, whose parents can pay a reduced fee at time of  service.  [facility] [place]  [address], [place] [phone]) [phone] Accepts children up to age 27 who are enrolled in [place] or [place] Health Choice; pregnant women with a Medicaid card; and children who have applied for Medicaid or [place] Health Choice, but were declined, whose parents can pay a reduced fee at time of service.  [facility]  [address], [place] [phone]) [phone] Patients are seen by appointment only. Walk-ins are not accepted. [facility] will see patients 27 years of age and older. Monday - Tuesday (8am-5pm) Most Wednesdays (8:30-5pm) $30 per visit, cash only  [facility]  [address], [facility] [phone](336) [phone] Patients are seen by appointment only. Walk-ins are not accepted. [facility] will see patients 27 years of age and older. One Wednesday Evening (Monthly: Volunteer Based).  $30 per visit, cash only  [facility]  ([phone] for adults; Children under age 414, call Graduate Pediatric Dentistry at [phone]. Children aged 114-14, please call [phone] to request a pediatric application.  Dental services are provided in all areas of dental care including fillings, crowns and bridges, complete and partial dentures, implants, gum treatment, root canals, and extractions. Preventive care is also provided. Treatment is provided to both adults and children. Patients are selected via a lottery and there is often a waiting list.   [facility] [address], [place]  [phone] [email or web address]   [facility] [address], [place], [place] [phone], Ext. [phone] Second and Fourth Thursday of each month, opens at 6:30 AM; Clinic ends at 9 AM.  Patients are seen on a first-come first-served basis, and a limited number are seen during each clinic.   [facility]  [address], [place],  [place] [phone]) [phone]   Eligibility Requirements You must have lived in [place], [place], or [place] for at least the last three months.   You cannot be eligible for state or federal sponsored National Cityhealthcare insurance, including CIGNAVeterans Administration, [place]icaid, or [employer] EntertainmentMedicare.   You generally cannot be eligible for healthcare insurance through your employer.    How to apply: Eligibility screenings are held every Tuesday and Wednesday afternoon from 1:00 pm until 4:00 pm. You do not need an  appointment for the interview!  [facility] [address], [place], [place] [phone]   [facility]  [phone]   [facility]  [phone]   [facility]  [phone]    Behavioral Health Resources in the Community: Intensive Outpatient Programs Organization         Address  Phone  Notes  [facility] [address], [place], [place] [phone]   [facility] [address], [place], [place] [phone]   [facility] [address], [place], [place]  [phone]   [facility] [address],  [place], [place] [phone] or [phone]   Substance Abuse Resources Organization         Address  Phone  Notes  [facility]  [phone]   [facility]  [phone]   [facility]  [phone]   [facility]  [phone]   Residential & Outpatient Substance Abuse Program  [phone]   Psychological Services Organization         Address  Phone  Notes  [facility]  [phone]   [facility]  [phone]- [phone]   [facility] [address], [place] [phone] or [phone]    Mobile Crisis Teams Organization         Address  Phone  Notes  [facility], Mobile Crisis Care Unit  [phone]   [facility]  [address] [place], [place] [phone]   Doristine LocksSharon DeEsch [address] [place] [phone]    Self-Help/Support Groups Organization         Address  Phone             Notes  [facility] [place] - variety of support groups  336- [phone] Call for more information  Narcotics Anonymous (NA), [facility] [address], [place]  2 meetings at this location   Statisticianesidential Treatment Programs Organization         Address  Phone  Notes  [facility] [address],    [place]  [phone]   [facility]  [address], [place] [phone], [place], [place] [phone]   [facility] [address], [place] [phone] Admissions: 8am-3pm M-F  [facility] [address]    [place], [place] [phone]   [facility] [address], [place], [place] [phone]   [facility] [address]  [place], [place] [phone]   [facility] - Intensive Outpatient [address], [place], [place] [phone]   [facility] ([facility] [address]  [place], [place] [phone] or [phone]   [facility] ([facility]) [address] [place], [place] [phone] Accepts Medicaid  [facility] [address]  [place] [phone] Substance Abuse/Addiction Treatment   [facility] Resources Organization         Address  Phone  Notes  [facility]  [phone]) [phone]   Angie FavaJulie Brannon, PhD [address], [address] [place], [place]   [phone]) [phone] or [phone]) [phone]   [facility]   [address] [place], [place] [phone](336) [phone]   [facility] [address], [place], [place] [phone](336) [phone] Insurance/Medicaid/sponsorship through [facility]  [facility] [address]  [place], [place] ([phone]) [phone]  Therapy/tele-psych/case  °[facility] [address]  ° [place],  ([phone]) [phone]    °Dr. Arfeen  ([phone]) [phone]   °[facility] [place]  [employer] [place] [facility] 1) [address], [place] °2) [address], [place] °3)  [address], [place] (336) [phone] °(336) [phone] ° °(336) [phone]   °[place] Child Abuse Hotline (336) 342-1394 or (336) [phone] (After Hours)    ° ° ° °

## 2014-08-28 NOTE — ED Notes (Signed)
Pt was asked to change into gown. Sheet and blanket provided. Pt requesting pain medication. She was informed that she awaiting PA to see her. Pt bursts into tears. Pt states "just don't want nobody to rush and make mistakes". Pt was informed that our priority is patient safety. I asked patient if she would like a warm blanket and patient shrugs shoulders. Warm blanket provided.

## 2014-08-28 NOTE — ED Notes (Signed)
Pt vomiting.

## 2014-10-13 DIAGNOSIS — Z8619 Personal history of other infectious and parasitic diseases: Secondary | ICD-10-CM

## 2014-10-13 HISTORY — DX: Personal history of other infectious and parasitic diseases: Z86.19

## 2014-10-13 NOTE — L&D Delivery Note (Cosign Needed)
Delivery Note  Patient presented the morning of 12/24 for PDIOL. She was augmented with cytotec, pitocin, and AROM 8 hours prior to delivery. She experienced intermittent variable and late decels throughout a protracted active phase, improved with amnioinfusion. Once complete pushed for approximately 20 minutes.   At 1:12 AM a viable female was delivered via Vaginal, Spontaneous Delivery (Presentation: OA).  APGAR: 8/9; weight 3671  g.   Placenta status: intact. Cord: 3 vessel.  with the following complications: none.  Cord pH: 7.238  Anesthesia: Epidural  Episiotomy:  none Lacerations:  none Est. Blood Loss (mL):  150 ml  Mom to postpartum.  Baby to Couplet care / Skin to Skin.  Rhonda Blanchard 10/07/2015, 1:24 AM

## 2014-12-20 ENCOUNTER — Encounter (HOSPITAL_COMMUNITY): Payer: Self-pay | Admitting: Emergency Medicine

## 2014-12-20 ENCOUNTER — Emergency Department (HOSPITAL_COMMUNITY)
Admission: EM | Admit: 2014-12-20 | Discharge: 2014-12-20 | Discharge status: Against Medical Advice | Payer: Self-pay | Attending: Emergency Medicine | Admitting: Emergency Medicine

## 2014-12-20 DIAGNOSIS — R0981 Nasal congestion: Secondary | ICD-10-CM | POA: Insufficient documentation

## 2014-12-20 DIAGNOSIS — R630 Anorexia: Secondary | ICD-10-CM | POA: Insufficient documentation

## 2014-12-20 DIAGNOSIS — H9201 Otalgia, right ear: Secondary | ICD-10-CM | POA: Insufficient documentation

## 2014-12-20 DIAGNOSIS — R05 Cough: Secondary | ICD-10-CM | POA: Insufficient documentation

## 2014-12-20 DIAGNOSIS — R509 Fever, unspecified: Secondary | ICD-10-CM | POA: Insufficient documentation

## 2014-12-20 DIAGNOSIS — R6889 Other general symptoms and signs: Secondary | ICD-10-CM

## 2014-12-20 DIAGNOSIS — R0989 Other specified symptoms and signs involving the circulatory and respiratory systems: Secondary | ICD-10-CM | POA: Insufficient documentation

## 2014-12-20 NOTE — ED Notes (Signed)
Pt not in room.

## 2014-12-20 NOTE — ED Notes (Signed)
Pt presents with c/o fever, feeling hot and cold, right earache, decreased appetite, cough, congestion, scratchy throat, and diarrhea yesterday and today

## 2014-12-20 NOTE — ED Provider Notes (Signed)
2315 Micah Flesher- Went into patient's room to evaluate symptoms. Patient and belongings are not in the room. Patient was looked for in the emergency department as well as the waiting room and was not found. Will disposition the patient has eloped.  Filed Vitals:   12/20/14 2223  BP: 141/77  Pulse: 78  Temp: 99.1 F (37.3 C)  TempSrc: Oral  Resp: 20  SpO2: 97%     Antony MaduraKelly Shalawn Wynder, PA-C 12/20/14 2326  April Palumbo, MD 12/20/14 2328

## 2015-01-19 ENCOUNTER — Inpatient Hospital Stay (HOSPITAL_COMMUNITY)
Admission: AD | Admit: 2015-01-19 | Discharge: 2015-01-19 | Disposition: A | Discharge status: Home / Self Care | Payer: Self-pay | Source: Ambulatory Visit | Attending: Family Medicine | Admitting: Family Medicine

## 2015-01-19 ENCOUNTER — Inpatient Hospital Stay (HOSPITAL_COMMUNITY): Payer: Self-pay

## 2015-01-19 ENCOUNTER — Encounter (HOSPITAL_COMMUNITY): Payer: Self-pay | Admitting: *Deleted

## 2015-01-19 DIAGNOSIS — N938 Other specified abnormal uterine and vaginal bleeding: Secondary | ICD-10-CM

## 2015-01-19 DIAGNOSIS — Z3A00 Weeks of gestation of pregnancy not specified: Secondary | ICD-10-CM | POA: Insufficient documentation

## 2015-01-19 DIAGNOSIS — R102 Pelvic and perineal pain: Secondary | ICD-10-CM | POA: Insufficient documentation

## 2015-01-19 DIAGNOSIS — O9989 Other specified diseases and conditions complicating pregnancy, childbirth and the puerperium: Secondary | ICD-10-CM | POA: Insufficient documentation

## 2015-01-19 DIAGNOSIS — R109 Unspecified abdominal pain: Secondary | ICD-10-CM | POA: Insufficient documentation

## 2015-01-19 DIAGNOSIS — O209 Hemorrhage in early pregnancy, unspecified: Secondary | ICD-10-CM

## 2015-01-19 HISTORY — DX: Cardiac murmur, unspecified: R01.1

## 2015-01-19 LAB — URINE MICROSCOPIC-ADD ON

## 2015-01-19 LAB — URINALYSIS, ROUTINE W REFLEX MICROSCOPIC
Bilirubin Urine: NEGATIVE
Glucose, UA: NEGATIVE mg/dL
Ketones, ur: NEGATIVE mg/dL
Leukocytes, UA: NEGATIVE
Nitrite: NEGATIVE
Protein, ur: NEGATIVE mg/dL
Specific Gravity, Urine: >1.03 — ABNORMAL HIGH (ref 1.005–1.030)
Urobilinogen, UA: 0.2 mg/dL (ref 0.0–1.0)
pH: 6 (ref 5.0–8.0)

## 2015-01-19 LAB — POCT PREGNANCY, URINE: Preg Test, Ur: POSITIVE — AB

## 2015-01-19 LAB — CBC
HCT: 37.2 % (ref 36.0–46.0)
Hemoglobin: 12.8 g/dL (ref 12.0–15.0)
MCH: 32 pg (ref 26.0–34.0)
MCHC: 34.4 g/dL (ref 30.0–36.0)
MCV: 93 fL (ref 78.0–100.0)
PLATELETS: 161 10*3/uL (ref 150–400)
RBC: 4 MIL/uL (ref 3.87–5.11)
RDW: 13.9 % (ref 11.5–15.5)
WBC: 6.6 10*3/uL (ref 4.0–10.5)

## 2015-01-19 LAB — HCG, QUANTITATIVE, PREGNANCY: hCG, Beta Chain, Quant, S: 350 m[IU]/mL — ABNORMAL HIGH (ref <5)

## 2015-01-19 NOTE — MAU Provider Note (Signed)
History     CSN: [id number]  Arrival date and time: 01/19/15 0941   None     Chief Complaint  Patient presents with  . Possible Pregnancy  . Abdominal Cramping  . Vaginal Bleeding   HPI This is a 29 y.o. female at Unknown [place] who presents with c/o irregular menstrual bleeding for past two months. States had a + HPT at home 3 weeks ago. Started having more bleeding and cramping 4 days ago. Denies fever. Does not really want to be pregnant but was not contracepting.  RN note:     Expand All Collapse All   Past 2 months period has been crazy, come on for a few day, goes off, and 2 wks later starts again. 3 wks ago had a + HPT. Started bleeding and cramping 4 days ago.          OB History    Gravida Para Term Preterm AB TAB SAB Ectopic Multiple Living   3 1 1  1  1   1       No past medical history on file.  No past surgical history on file.  No family history on file.  History  Substance Use Topics  . Smoking status: Not on file  . Smokeless tobacco: Not on file  . Alcohol Use: Not on file    Allergies: Allergies not on file  No prescriptions prior to admission    Review of Systems  Constitutional: Negative for fever, chills and malaise/fatigue.  Gastrointestinal: Positive for abdominal pain (some cramps occasionally). Negative for nausea, vomiting, diarrhea and constipation.  Genitourinary: Negative for dysuria.  Musculoskeletal: Negative for myalgias.  Neurological: Negative for dizziness, weakness and headaches.   Physical Exam   Blood pressure 132/82, pulse 81, temperature 98.1 F (36.7 C), temperature source Oral, resp. rate 16, height 5\' 4"  (1.626 m), weight 121 lb (54.885 kg).  Physical Exam  Constitutional: She is oriented to person, place, and time. She appears well-developed and well-nourished. No distress.  HENT:  Head: Normocephalic.  Cardiovascular: Normal rate and regular rhythm.  Exam reveals no gallop and no friction rub.   No murmur  heard. Respiratory: Effort normal. No respiratory distress.  GI: Soft. She exhibits no distension. There is no tenderness. There is no rebound and no guarding.  Genitourinary: Vaginal discharge (small amount of blood) found.  Vagina clear, no lesions Uterus small and nontender No adnexal tenderness   Musculoskeletal: Normal range of motion.  Neurological: She is alert and oriented to person, place, and time.  Skin: Skin is warm and dry.  Psychiatric: She has a normal mood and affect.    MAU Course  Procedures  MDM Refused STD testing Labs and US done Results for orders placed or performed during the hospital encounter of 01/19/15 (from the past 72 hour(s))  Urinalysis, Routine w reflex microscopic     Status: Abnormal   Collection Time: 01/19/15 10:00 AM  Result Value Ref Range   Color, Urine YELLOW YELLOW   APPearance CLEAR CLEAR   Specific Gravity, Urine >1.030 (H) 1.005 - 1.030   pH 6.0 5.0 - 8.0   Glucose, UA NEGATIVE NEGATIVE mg/dL   Hgb urine dipstick SMALL (A) NEGATIVE   Bilirubin Urine NEGATIVE NEGATIVE   Ketones, ur NEGATIVE NEGATIVE mg/dL   Protein, ur NEGATIVE NEGATIVE mg/dL   Urobilinogen, UA 0.2 0.0 - 1.0 mg/dL   Nitrite NEGATIVE NEGATIVE   Leukocytes, UA NEGATIVE NEGATIVE  Pregnancy, urine POC     Status: Abnormal  Collection Time: 01/19/15 10:00 AM  Result Value Ref Range   Preg Test, Ur POSITIVE (A) NEGATIVE    Comment:        THE SENSITIVITY OF THIS METHODOLOGY IS >24 mIU/mL   Urine microscopic-add on     Status: Abnormal   Collection Time: 01/19/15 10:00 AM  Result Value Ref Range   Squamous Epithelial / LPF FEW (A) RARE   RBC / HPF 0-2 <3 RBC/hpf   Bacteria, UA RARE RARE  CBC     Status: None   Collection Time: 01/19/15 10:13 AM  Result Value Ref Range   WBC 6.6 4.0 - 10.5 K/uL   RBC 4.00 3.87 - 5.11 MIL/uL   Hemoglobin 12.8 12.0 - 15.0 g/dL   HCT 04.5 40.9 - 81.1 %   MCV 93.0 78.0 - 100.0 fL   MCH 32.0 26.0 - 34.0 pg   MCHC 34.4 30.0 -  36.0 g/dL   RDW 91.4 78.2 - 95.6 %   Platelets 161 150 - 400 K/uL  hCG, quantitative, pregnancy     Status: Abnormal   Collection Time: 01/19/15 10:14 AM  Result Value Ref Range   hCG, Beta Chain, Quant, S 350 (H) <5 mIU/mL    Comment:          GEST. AGE      CONC.  (mIU/mL)   <=1 WEEK        5 - 50     2 WEEKS       50 - 500     3 WEEKS       100 - 10,000     4 WEEKS     1,000 - 30,000     5 WEEKS     3,500 - 115,000   6-8 WEEKS     12,000 - 270,000    12 WEEKS     15,000 - 220,000        FEMALE AND NON-PREGNANT FEMALE:     LESS THAN 5 mIU/mL    US Ob Comp Less 14 Wks  01/19/2015   CLINICAL DATA:  First trimester bleeding. Pelvic pain and cramping for 4 days.  EXAM: OBSTETRIC <14 WK [place] AND TRANSVAGINAL OB US  TECHNIQUE: Both transabdominal and transvaginal ultrasound examinations were performed for complete evaluation of the gestation as well as the maternal uterus, adnexal regions, and pelvic cul-de-sac. Transvaginal technique was performed to assess early pregnancy.  COMPARISON:  None.  FINDINGS: No intrauterine gestational sac or other fluid collection visualized in endometrial cavity. Endometrial thickness measures approximately 18 mm. No fibroids identified.  Both ovaries are normal in appearance. No adnexal mass or free fluid identified.  IMPRESSION: Pregnancy location not visualized sonographically. Differential diagnosis includes recent spontaneous abortion, IUP too early to visualize, and occult ectopic pregnancy. Recommend close follow up of quantitative B-HCG levels, and follow up US as clinically warranted.   Electronically Signed   By: Myles Rosenthal M.D.   On: 01/19/2015 12:33   US Ob Transvaginal  01/19/2015   CLINICAL DATA:  First trimester bleeding. Pelvic pain and cramping for 4 days.  EXAM: OBSTETRIC <14 WK [place] AND TRANSVAGINAL OB US  TECHNIQUE: Both transabdominal and transvaginal ultrasound examinations were performed for complete evaluation of the gestation as well as the  maternal uterus, adnexal regions, and pelvic cul-de-sac. Transvaginal technique was performed to assess early pregnancy.  COMPARISON:  None.  FINDINGS: No intrauterine gestational sac or other fluid collection visualized in endometrial cavity. Endometrial thickness measures approximately 18  mm. No fibroids identified.  Both ovaries are normal in appearance. No adnexal mass or free fluid identified.  IMPRESSION: Pregnancy location not visualized sonographically. Differential diagnosis includes recent spontaneous abortion, IUP too early to visualize, and occult ectopic pregnancy. Recommend close follow up of quantitative B-HCG levels, and follow up US as clinically warranted.   Electronically Signed   By: Myles Rosenthal M.D.   On: 01/19/2015 12:33     Assessment and Plan  A:  Pregnancy at Unknown [place]       Unable to verify pregnancy location   P:  Discussed findings       Advised to return in 2 days to check Quant HCG level       If it rises appropriately, repeat US in 7-10 days.       Ectopic precautions.       Return to MAU for f/u HCG level  [facility] 01/19/2015, 10:00 AM

## 2015-01-19 NOTE — MAU Note (Signed)
Past 2 months period has been crazy, come on for a few day, goes off, and 2 wks later starts again.  3 wks ago had a + HPT. Started bleeding and cramping 4 days ago.

## 2015-01-19 NOTE — Discharge Instructions (Signed)
Pelvic Rest °Pelvic rest is sometimes recommended for women when:  °· The placenta is partially or completely covering the opening of the cervix (placenta previa). °· There is bleeding between the uterine wall and the amniotic sac in the first trimester (subchorionic hemorrhage). °· The cervix begins to open without labor starting (incompetent cervix, cervical insufficiency). °· The labor is too early (preterm labor). °HOME CARE INSTRUCTIONS °· Do not have sexual intercourse, stimulation, or an orgasm. °· Do not use tampons, douche, or put anything in the vagina. °· Do not lift anything over 10 pounds (4.5 kg). °· Avoid strenuous activity or straining your pelvic muscles. °SEEK MEDICAL CARE IF:  °· You have any vaginal bleeding during pregnancy. Treat this as a potential emergency. °· You have cramping pain felt low in the stomach (stronger than menstrual cramps). °· You notice vaginal discharge (watery, mucus, or bloody). °· You have a low, dull backache. °· There are regular contractions or uterine tightening. °SEEK IMMEDIATE MEDICAL CARE IF: °You have vaginal bleeding and have placenta previa.  °Document Released: 01/24/2011 Document Revised: 12/22/2011 Document Reviewed: 01/24/2011 °[employer]® Patient Information ©2015 [employer]. This information is not intended to replace advice given to you by your health care provider. Make sure you discuss any questions you have with your health care provider. ° °Vaginal Bleeding During Pregnancy, First Trimester °A small amount of bleeding (spotting) from the vagina is common in early pregnancy. Sometimes the bleeding is normal and is not a problem, and sometimes it is a sign of something serious. Be sure to tell your doctor about any bleeding from your vagina right away. °HOME CARE °· Watch your condition for any changes. °· Follow your doctor's instructions about how active you can be. °· If you are on bed rest: °¨ You may need to stay in bed and only get up to use the  bathroom. °¨ You may be allowed to do some activities. °¨ If you need help, make plans for someone to help you. °· Write down: °¨ The number of pads you use each day. °¨ How often you change pads. °¨ How soaked (saturated) your pads are. °· Do not use tampons. °· Do not douche. °· Do not have sex or orgasms until your doctor says it is okay. °· If you pass any tissue from your vagina, save the tissue so you can show it to your doctor. °· Only take medicines as told by your doctor. °· Do not take aspirin because it can make you bleed. °· Keep all follow-up visits as told by your doctor. °GET HELP IF:  °· You bleed from your vagina. °· You have cramps. °· You have labor pains. °· You have a fever that does not go away after you take medicine. °GET HELP RIGHT AWAY IF:  °· You have very bad cramps in your back or belly (abdomen). °· You pass large clots or tissue from your vagina. °· You bleed more. °· You feel light-headed or weak. °· You pass out (faint). °· You have chills. °· You are leaking fluid or have a gush of fluid from your vagina. °· You pass out while pooping (having a bowel movement). °MAKE SURE YOU: °· Understand these instructions. °· Will watch your condition. °· Will get help right away if you are not doing well or get worse. °Document Released: 02/13/2014 Document Reviewed: 06/06/2013 °ExitCare® Patient Information ©2015 [employer]. This information is not intended to replace advice given to you by your health care provider. Make   sure you discuss any questions you have with your health care provider. ° °

## 2015-02-12 ENCOUNTER — Inpatient Hospital Stay (HOSPITAL_COMMUNITY)
Admission: AD | Admit: 2015-02-12 | Discharge: 2015-02-12 | Disposition: A | Discharge status: Home / Self Care | Payer: Self-pay | Source: Ambulatory Visit | Attending: Family Medicine | Admitting: Family Medicine

## 2015-02-12 ENCOUNTER — Encounter (HOSPITAL_COMMUNITY): Payer: Self-pay | Admitting: *Deleted

## 2015-02-12 ENCOUNTER — Encounter (HOSPITAL_COMMUNITY): Payer: Self-pay

## 2015-02-12 ENCOUNTER — Inpatient Hospital Stay (HOSPITAL_COMMUNITY): Payer: Self-pay

## 2015-02-12 DIAGNOSIS — Z87891 Personal history of nicotine dependence: Secondary | ICD-10-CM | POA: Insufficient documentation

## 2015-02-12 DIAGNOSIS — R1032 Left lower quadrant pain: Secondary | ICD-10-CM | POA: Insufficient documentation

## 2015-02-12 DIAGNOSIS — O211 Hyperemesis gravidarum with metabolic disturbance: Secondary | ICD-10-CM | POA: Insufficient documentation

## 2015-02-12 DIAGNOSIS — O3680X0 Pregnancy with inconclusive fetal viability, not applicable or unspecified: Secondary | ICD-10-CM

## 2015-02-12 DIAGNOSIS — Z3A01 Less than 8 weeks gestation of pregnancy: Secondary | ICD-10-CM | POA: Insufficient documentation

## 2015-02-12 DIAGNOSIS — O219 Vomiting of pregnancy, unspecified: Secondary | ICD-10-CM

## 2015-02-12 LAB — CBC
HCT: 35.1 % — ABNORMAL LOW (ref 36.0–46.0)
Hemoglobin: 12.4 g/dL (ref 12.0–15.0)
MCH: 32.2 pg (ref 26.0–34.0)
MCHC: 35.3 g/dL (ref 30.0–36.0)
MCV: 91.2 fL (ref 78.0–100.0)
Platelets: 188 10*3/uL (ref 150–400)
RBC: 3.85 MIL/uL — ABNORMAL LOW (ref 3.87–5.11)
RDW: 13.7 % (ref 11.5–15.5)
WBC: 10.2 10*3/uL (ref 4.0–10.5)

## 2015-02-12 LAB — WET PREP, GENITAL
Trich, Wet Prep: NONE SEEN
YEAST WET PREP: NONE SEEN

## 2015-02-12 LAB — URINALYSIS, ROUTINE W REFLEX MICROSCOPIC
Bilirubin Urine: NEGATIVE
Glucose, UA: NEGATIVE mg/dL
Hgb urine dipstick: NEGATIVE
KETONES UR: 15 mg/dL — AB
Leukocytes, UA: NEGATIVE
NITRITE: NEGATIVE
Protein, ur: NEGATIVE mg/dL
Urobilinogen, UA: 0.2 mg/dL (ref 0.0–1.0)
pH: 7 (ref 5.0–8.0)

## 2015-02-12 LAB — HCG, QUANTITATIVE, PREGNANCY: hCG, Beta Chain, Quant, S: 223656 m[IU]/mL — ABNORMAL HIGH (ref <5)

## 2015-02-12 LAB — POCT PREGNANCY, URINE: Preg Test, Ur: POSITIVE — AB

## 2015-02-12 MED ORDER — PANTOPRAZOLE SODIUM 20 MG PO TBEC: 20.0000 mg | DELAYED_RELEASE_TABLET | Freq: Every day | ORAL | Status: DC

## 2015-02-12 MED ORDER — PROMETHAZINE HCL 25 MG/ML IJ SOLN: 12.5000 mg | Freq: Once | INTRAMUSCULAR | Status: DC

## 2015-02-12 MED ORDER — LACTATED RINGERS IV SOLN
INTRAVENOUS | Status: DC
  Administered 2015-02-12 (×2): via INTRAVENOUS

## 2015-02-12 MED ORDER — DOXYLAMINE-PYRIDOXINE 10-10 MG PO TBEC: 2.0000 | DELAYED_RELEASE_TABLET | Freq: Every evening | ORAL | Status: DC | PRN

## 2015-02-12 MED ORDER — PANTOPRAZOLE SODIUM 40 MG IV SOLR
40.0000 mg | Freq: Once | INTRAVENOUS | Status: AC
  Administered 2015-02-12: 40 mg via INTRAVENOUS
  Filled 2015-02-12: qty 40

## 2015-02-12 MED ORDER — METOCLOPRAMIDE HCL 5 MG/ML IJ SOLN
10.0000 mg | Freq: Four times a day (QID) | INTRAMUSCULAR | Status: DC | PRN
  Administered 2015-02-12: 10 mg via INTRAVENOUS
  Filled 2015-02-12: qty 2
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
## 2015-02-12 NOTE — MAU Note (Signed)
Pt presents to MAU with complaints of lower abdominal cramping and not able to keep anything down. Denies any vaginal bleeding

## 2015-02-12 NOTE — Discharge Instructions (Signed)
First Trimester of Pregnancy °The first trimester of pregnancy is from week 1 until the end of week 12 (months 1 through 3). A week after a sperm fertilizes an egg, the egg will implant on the wall of the uterus. This embryo will begin to develop into a baby. Genes from you and your partner are forming the baby. The female genes determine whether the baby is a boy or a girl. At 6-8 weeks, the eyes and face are formed, and the heartbeat can be seen on ultrasound. At the end of 12 weeks, all the baby's organs are formed.  °Now that you are pregnant, you will want to do everything you can to have a healthy baby. Two of the most important things are to get good prenatal care and to follow your health care provider's instructions. Prenatal care is all the medical care you receive before the baby's birth. This care will help prevent, find, and treat any problems during the pregnancy and childbirth. °BODY CHANGES °Your body goes through many changes during pregnancy. The changes vary from woman to woman.  °· You may gain or lose a couple of pounds at first. °· You may feel sick to your stomach (nauseous) and throw up (vomit). If the vomiting is uncontrollable, call your health care provider. °· You may tire easily. °· You may develop headaches that can be relieved by medicines approved by your health care provider. °· You may urinate more often. Painful urination may mean you have a bladder infection. °· You may develop heartburn as a result of your pregnancy. °· You may develop constipation because certain hormones are causing the muscles that push waste through your intestines to slow down. °· You may develop hemorrhoids or swollen, bulging veins (varicose veins). °· Your breasts may begin to grow larger and become tender. Your nipples may stick out more, and the tissue that surrounds them (areola) may become darker. °· Your gums may bleed and may be sensitive to brushing and flossing. °· Dark spots or blotches (chloasma,  mask of pregnancy) may develop on your face. This will likely fade after the baby is born. °· Your menstrual periods will stop. °· You may have a loss of appetite. °· You may develop cravings for certain kinds of food. °· You may have changes in your emotions from day to day, such as being excited to be pregnant or being concerned that something may go wrong with the pregnancy and baby. °· You may have more vivid and strange dreams. °· You may have changes in your hair. These can include thickening of your hair, rapid growth, and changes in texture. Some women also have hair loss during or after pregnancy, or hair that feels dry or thin. Your hair will most likely return to normal after your baby is born. °WHAT TO EXPECT AT YOUR PRENATAL VISITS °During a routine prenatal visit: °· You will be weighed to make sure you and the baby are growing normally. °· Your blood pressure will be taken. °· Your abdomen will be measured to track your baby's growth. °· The fetal heartbeat will be listened to starting around week 10 or 12 of your pregnancy. °· Test results from any previous visits will be discussed. °Your health care provider may ask you: °· How you are feeling. °· If you are feeling the baby move. °· If you have had any abnormal symptoms, such as leaking fluid, bleeding, severe headaches, or abdominal cramping. °· If you have any questions. °Other tests   that may be performed during your first trimester include: °· Blood tests to find your blood type and to check for the presence of any previous infections. They will also be used to check for low iron levels (anemia) and Rh antibodies. Later in the pregnancy, blood tests for diabetes will be done along with other tests if problems develop. °· Urine tests to check for infections, diabetes, or protein in the urine. °· An ultrasound to confirm the proper growth and development of the baby. °· An amniocentesis to check for possible genetic problems. °· Fetal screens for  spina bifida and Down syndrome. °· You may need other tests to make sure you and the baby are doing well. °HOME CARE INSTRUCTIONS  °Medicines °· Follow your health care provider's instructions regarding medicine use. Specific medicines may be either safe or unsafe to take during pregnancy. °· Take your prenatal vitamins as directed. °· If you develop constipation, try taking a stool softener if your health care provider approves. °Diet °· Eat regular, well-balanced meals. Choose a variety of foods, such as meat or vegetable-based protein, fish, milk and low-fat dairy products, vegetables, fruits, and whole grain breads and cereals. Your health care provider will help you determine the amount of weight gain that is right for you. °· Avoid raw meat and uncooked cheese. These carry germs that can cause birth defects in the baby. °· Eating four or five small meals rather than three large meals a day may help relieve nausea and vomiting. If you start to feel nauseous, eating a few soda crackers can be helpful. Drinking liquids between meals instead of during meals also seems to help nausea and vomiting. °· If you develop constipation, eat more high-fiber foods, such as fresh vegetables or fruit and whole grains. Drink enough fluids to keep your urine clear or pale yellow. °Activity and Exercise °· Exercise only as directed by your health care provider. Exercising will help you: °¨ Control your weight. °¨ Stay in shape. °¨ Be prepared for labor and delivery. °· Experiencing pain or cramping in the lower abdomen or low back is a good sign that you should stop exercising. Check with your health care provider before continuing normal exercises. °· Try to avoid standing for long periods of time. Move your legs often if you must stand in one place for a long time. °· Avoid heavy lifting. °· Wear low-heeled shoes, and practice good posture. °· You may continue to have sex unless your health care provider directs you  otherwise. °Relief of Pain or Discomfort °· Wear a good support bra for breast tenderness.   °· Take warm sitz baths to soothe any pain or discomfort caused by hemorrhoids. Use hemorrhoid cream if your health care provider approves.   °· Rest with your legs elevated if you have leg cramps or low back pain. °· If you develop varicose veins in your legs, wear support hose. Elevate your feet for 15 minutes, 3-4 times a day. Limit salt in your diet. °Prenatal Care °· Schedule your prenatal visits by the twelfth week of pregnancy. They are usually scheduled monthly at first, then more often in the last 2 months before delivery. °· Write down your questions. Take them to your prenatal visits. °· Keep all your prenatal visits as directed by your health care provider. °Safety °· Wear your seat belt at all times when driving. °· Make a list of emergency phone numbers, including numbers for family, friends, the hospital, and police and fire departments. °General Tips °·   Ask your health care provider for a referral to a local prenatal education class. Begin classes no later than at the beginning of month 6 of your pregnancy.  Ask for help if you have counseling or nutritional needs during pregnancy. Your health care provider can offer advice or refer you to specialists for help with various needs.  Do not use hot tubs, steam rooms, or saunas.  Do not douche or use tampons or scented sanitary pads.  Do not cross your legs for long periods of time.  Avoid cat litter boxes and soil used by cats. These carry germs that can cause birth defects in the baby and possibly loss of the fetus by miscarriage or stillbirth.  Avoid all smoking, herbs, alcohol, and medicines not prescribed by your health care provider. Chemicals in these affect the formation and growth of the baby.  Schedule a dentist appointment. At home, brush your teeth with a soft toothbrush and be gentle when you floss. SEEK MEDICAL CARE IF:   You have  dizziness.  You have mild pelvic cramps, pelvic pressure, or nagging pain in the abdominal area.  You have persistent nausea, vomiting, or diarrhea.  You have a bad smelling vaginal discharge.  You have pain with urination.  You notice increased swelling in your face, hands, legs, or ankles. SEEK IMMEDIATE MEDICAL CARE IF:   You have a fever.  You are leaking fluid from your vagina.  You have spotting or bleeding from your vagina.  You have severe abdominal cramping or pain.  You have rapid weight gain or loss.  You vomit blood or material that looks like coffee grounds.  You are exposed to [place] measles and have never had them.  You are exposed to fifth disease or chickenpox.  You develop a severe headache.  You have shortness of breath.  You have any kind of trauma, such as from a fall or a car accident. Document Released: 09/23/2001 Document Revised: 02/13/2014 Document Reviewed: 08/09/2013 [facility] Patient Information 2015 [place]. This information is not intended to replace advice given to you by your health care provider. Make sure you discuss any questions you have with your health care provider.   Morning Sickness Morning sickness is when you feel sick to your stomach (nauseous) during pregnancy. This nauseous feeling may or may not come with vomiting. It often occurs in the morning but can be a problem any time of day. Morning sickness is most common during the first trimester, but it may continue throughout pregnancy. While morning sickness is unpleasant, it is usually harmless unless you develop severe and continual vomiting (hyperemesis gravidarum). This condition requires more intense treatment.  CAUSES  The cause of morning sickness is not completely known but seems to be related to normal hormonal changes that occur in pregnancy. RISK FACTORS You are at greater risk if you:  Experienced nausea or vomiting before your pregnancy.  Had morning  sickness during a previous pregnancy.  Are pregnant with more than one baby, such as twins. TREATMENT  Do not use any medicines (prescription, over-the-counter, or herbal) for morning sickness without first talking to your health care provider. Your health care provider may prescribe or recommend:  Vitamin B6 supplements.  Anti-nausea medicines.  The herbal medicine ginger. HOME CARE INSTRUCTIONS   Only take over-the-counter or prescription medicines as directed by your health care provider.  Taking multivitamins before getting pregnant can prevent or decrease the severity of morning sickness in most women.  Eat a piece of  dry toast or unsalted crackers before getting out of bed in the morning.  Eat five or six small meals a day.  Eat dry and bland foods (rice, baked potato). Foods high in carbohydrates are often helpful.  Do not drink liquids with your meals. Drink liquids between meals.  Avoid greasy, fatty, and spicy foods.  Get someone to cook for you if the smell of any food causes nausea and vomiting.  If you feel nauseous after taking prenatal vitamins, take the vitamins at night or with a snack.  Snack on protein foods (nuts, yogurt, cheese) between meals if you are hungry.  Eat unsweetened gelatins for desserts.  Wearing an acupressure wristband (worn for sea sickness) may be helpful.  Acupuncture may be helpful.  Do not smoke.  Get a humidifier to keep the air in your house free of odors.  Get plenty of fresh air. SEEK MEDICAL CARE IF:   Your home remedies are not working, and you need medicine.  You feel dizzy or lightheaded.  You are losing weight. SEEK IMMEDIATE MEDICAL CARE IF:   You have persistent and uncontrolled nausea and vomiting.  You pass out (faint). MAKE SURE YOU:  Understand these instructions.  Will watch your condition.  Will get help right away if you are not doing well or get worse.  Diclegis for Nausea Take 2 tablets at  bedtime. If symptoms persist, add 1 tab in the AM starting on day 3. If symptoms persist, add 1 tab in the PM starting day 4.   Nausea medication to take during pregnancy (if you cannot afford the prescription Diclegis, this is the same medicines):   Unisom (doxylamine succinate 25 mg tablets) Take one tablet daily at bedtime. If symptoms are not adequately controlled, the dose can be increased to a maximum recommended dose of two tablets daily (1/2 tablet in the morning, 1/2 tablet mid-afternoon and one at bedtime).  Vitamin B6  tablets. Take one tablet twice a day (up to 200 mg per day).    Document Released: 11/20/2006 Document Revised: 10/04/2013 Document Reviewed: 03/16/2013 [facility] Patient Information 2015 [place], [place]. This information is not intended to replace advice given to you by your health care provider. Make sure you discuss any questions you have with your health care provider.

## 2015-02-13 ENCOUNTER — Encounter (HOSPITAL_COMMUNITY): Payer: Self-pay | Admitting: Advanced Practice Midwife

## 2015-02-13 DIAGNOSIS — O98211 Gonorrhea complicating pregnancy, first trimester: Secondary | ICD-10-CM | POA: Insufficient documentation

## 2015-02-13 LAB — HIV ANTIBODY (ROUTINE TESTING W REFLEX): HIV Screen 4th Generation wRfx: NONREACTIVE

## 2015-02-13 LAB — GC/CHLAMYDIA PROBE AMP (~~LOC~~) NOT AT ARMC
CHLAMYDIA, DNA PROBE: NEGATIVE
Neisseria Gonorrhea: POSITIVE — AB

## 2015-04-27 ENCOUNTER — Inpatient Hospital Stay (HOSPITAL_COMMUNITY)
Admission: AD | Admit: 2015-04-27 | Discharge: 2015-04-27 | Disposition: A | Discharge status: Home / Self Care | Payer: Medicaid Other | Source: Ambulatory Visit | Attending: Family Medicine | Admitting: Family Medicine

## 2015-04-27 ENCOUNTER — Encounter (HOSPITAL_COMMUNITY): Payer: Self-pay | Admitting: *Deleted

## 2015-04-27 DIAGNOSIS — Z3A17 17 weeks gestation of pregnancy: Secondary | ICD-10-CM | POA: Diagnosis not present

## 2015-04-27 DIAGNOSIS — O9989 Other specified diseases and conditions complicating pregnancy, childbirth and the puerperium: Secondary | ICD-10-CM | POA: Insufficient documentation

## 2015-04-27 DIAGNOSIS — R42 Dizziness and giddiness: Secondary | ICD-10-CM | POA: Diagnosis not present

## 2015-04-27 DIAGNOSIS — Z87891 Personal history of nicotine dependence: Secondary | ICD-10-CM | POA: Diagnosis not present

## 2015-04-27 DIAGNOSIS — W19XXXA Unspecified fall, initial encounter: Secondary | ICD-10-CM | POA: Diagnosis not present

## 2015-04-27 LAB — URINALYSIS, ROUTINE W REFLEX MICROSCOPIC
BILIRUBIN URINE: NEGATIVE
GLUCOSE, UA: NEGATIVE mg/dL
Ketones, ur: NEGATIVE mg/dL
LEUKOCYTES UA: NEGATIVE
Nitrite: NEGATIVE
Protein, ur: NEGATIVE mg/dL
SPECIFIC GRAVITY, URINE: 1.025 (ref 1.005–1.030)
Urobilinogen, UA: 0.2 mg/dL (ref 0.0–1.0)
pH: 6.5 (ref 5.0–8.0)

## 2015-04-27 LAB — URINE MICROSCOPIC-ADD ON

## 2015-04-27 NOTE — Discharge Instructions (Signed)
What Do I Need to Know About Injuries During Pregnancy? °Injuries can happen during pregnancy. Minor falls and accidents usually do not harm you or your baby. However, any injury should be reported to your doctor. °WHAT CAN I DO TO PROTECT MYSELF FROM INJURIES? °· Remove rugs and loose objects on the floor. °· Wear comfortable shoes that have a good grip. Do not wear high-heeled shoes. °· Always wear your seat belt. The lap belt should be below your belly. Always practice safe driving. °· Do not ride on a motorcycle. °· Do not participate in high-impact activities or sports. °· Avoid: °¨ Walking on wet or slippery floors. °¨ Fires. °¨ Starting fires. °¨ Lifting heavy pots of boiling or hot liquids. °¨ Fixing electrical problems. °· Only take medicine as told by your doctor. °· Know your blood type and the blood type of the baby's father. °· Call your local emergency services (911 in the [place] if you are a victim of domestic violence or assault. For help and support, contact the National Domestic Violence Hotline. °WHEN SHOULD I GET HELP RIGHT AWAY? °· You fall on your belly or have any high-impact accident or injury. °· You have been a victim of domestic violence or any kind of violence. °· You have been in a car accident. °· You have bleeding from your vagina. °· Fluid is leaking from your vagina. °· You start to have belly cramping (contractions) or pain. °· You feel weak or pass out (faint). °· You start to throw up (vomit) after an injury. °· You have been burned. °· You have a stiff neck or neck pain. °· You get a headache or have vision problems after an injury. °· You do not feel the baby move or the baby is not moving as much as normal. °Document Released: 11/01/2010 Document Revised: 02/13/2014 Document Reviewed: 07/06/2013 °ExitCare® Patient Information ©2015 [employer]. This information is not intended to replace advice given to you by your health care provider. Make sure you discuss any questions you  have with your health care provider. ° °
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
## 2015-05-09 ENCOUNTER — Encounter: Payer: Self-pay | Admitting: Certified Nurse Midwife

## 2015-05-30 ENCOUNTER — Telehealth: Payer: Self-pay | Admitting: Family Medicine

## 2015-05-30 NOTE — Telephone Encounter (Signed)
Noticed patient had not come to an appointment she had scheduled in July. I called patient to see if she still needed an appointment. However, I had to leave a message for her to call us back to either reschedule her appointment, or let us know if she is receiving care with another provider.

## 2015-05-31 ENCOUNTER — Ambulatory Visit (HOSPITAL_COMMUNITY): Payer: Medicaid Other | Attending: Medical

## 2015-06-05 ENCOUNTER — Encounter (HOSPITAL_COMMUNITY): Payer: Self-pay | Admitting: *Deleted

## 2015-06-05 ENCOUNTER — Inpatient Hospital Stay (HOSPITAL_COMMUNITY): Payer: Medicaid Other

## 2015-06-05 ENCOUNTER — Inpatient Hospital Stay (HOSPITAL_COMMUNITY)
Admission: AD | Admit: 2015-06-05 | Discharge: 2015-06-05 | Disposition: A | Discharge status: Home Health | Payer: Medicaid Other | Source: Ambulatory Visit | Attending: Obstetrics & Gynecology | Admitting: Obstetrics & Gynecology

## 2015-06-05 DIAGNOSIS — R109 Unspecified abdominal pain: Secondary | ICD-10-CM | POA: Diagnosis not present

## 2015-06-05 DIAGNOSIS — Z87891 Personal history of nicotine dependence: Secondary | ICD-10-CM | POA: Diagnosis not present

## 2015-06-05 DIAGNOSIS — N939 Abnormal uterine and vaginal bleeding, unspecified: Secondary | ICD-10-CM

## 2015-06-05 DIAGNOSIS — R011 Cardiac murmur, unspecified: Secondary | ICD-10-CM | POA: Insufficient documentation

## 2015-06-05 DIAGNOSIS — O210 Mild hyperemesis gravidarum: Secondary | ICD-10-CM | POA: Insufficient documentation

## 2015-06-05 DIAGNOSIS — Z3A23 23 weeks gestation of pregnancy: Secondary | ICD-10-CM | POA: Diagnosis not present

## 2015-06-05 DIAGNOSIS — O98211 Gonorrhea complicating pregnancy, first trimester: Secondary | ICD-10-CM

## 2015-06-05 LAB — DIFFERENTIAL
BASOS ABS: 0 10*3/uL (ref 0.0–0.1)
BASOS PCT: 0 % (ref 0–1)
EOS ABS: 0.1 10*3/uL (ref 0.0–0.7)
EOS PCT: 1 % (ref 0–5)
LYMPHS ABS: 2 10*3/uL (ref 0.7–4.0)
Lymphocytes Relative: 21 % (ref 12–46)
MONO ABS: 0.9 10*3/uL (ref 0.1–1.0)
MONOS PCT: 10 % (ref 3–12)
Neutro Abs: 6.5 10*3/uL (ref 1.7–7.7)
Neutrophils Relative %: 69 % (ref 43–77)

## 2015-06-05 LAB — URINALYSIS, ROUTINE W REFLEX MICROSCOPIC
Bilirubin Urine: NEGATIVE
Glucose, UA: NEGATIVE mg/dL
HGB URINE DIPSTICK: NEGATIVE
Ketones, ur: NEGATIVE mg/dL
LEUKOCYTES UA: NEGATIVE
Nitrite: NEGATIVE
PH: 7.5 (ref 5.0–8.0)
Protein, ur: NEGATIVE mg/dL
Specific Gravity, Urine: 1.02 (ref 1.005–1.030)
Urobilinogen, UA: 0.2 mg/dL (ref 0.0–1.0)

## 2015-06-05 LAB — CBC
HEMATOCRIT: 31.4 % — AB (ref 36.0–46.0)
HEMOGLOBIN: 10.7 g/dL — AB (ref 12.0–15.0)
MCH: 32.5 pg (ref 26.0–34.0)
MCHC: 34.1 g/dL (ref 30.0–36.0)
MCV: 95.4 fL (ref 78.0–100.0)
Platelets: 167 10*3/uL (ref 150–400)
RBC: 3.29 MIL/uL — ABNORMAL LOW (ref 3.87–5.11)
RDW: 13.2 % (ref 11.5–15.5)
WBC: 9.4 10*3/uL (ref 4.0–10.5)

## 2015-06-05 LAB — HEPATITIS B SURFACE ANTIGEN: Hepatitis B Surface Ag: NEGATIVE

## 2015-06-05 LAB — WET PREP, GENITAL
TRICH WET PREP: NONE SEEN
Yeast Wet Prep HPF POC: NONE SEEN

## 2015-06-05 LAB — RAPID HIV SCREEN (HIV 1/2 AB+AG)
HIV 1/2 Antibodies: NONREACTIVE
HIV-1 P24 Antigen - HIV24: NONREACTIVE

## 2015-06-05 MED ORDER — METRONIDAZOLE 500 MG PO TABS: 500.0000 mg | ORAL_TABLET | Freq: Two times a day (BID) | ORAL | Status: DC

## 2015-06-05 MED ORDER — PROMETHAZINE HCL 12.5 MG PO TABS: 12.5000 mg | ORAL_TABLET | Freq: Three times a day (TID) | ORAL | Status: DC | PRN

## 2015-06-05 NOTE — MAU Note (Signed)
Cramping LLQ for the past 4 days, was spotting yesterday, none today.  Continues to have ongoing vomiting.  Denies LOF.

## 2015-06-05 NOTE — Discharge Instructions (Signed)
Please take your antibiotics (Flagyl) two times a day for seven days.  Also, it is VERY IMPORTANT to call our clinic TOMORROW MORNING to make a prenatal appointment. The number is [phone].    Bacterial Vaginosis Bacterial vaginosis is an infection of the vagina. It happens when too many of certain germs (bacteria) grow in the vagina. HOME CARE  Take your medicine as told by your doctor.  Finish your medicine even if you start to feel better.  Do not have sex until you finish your medicine and are better.  Tell your sex partner that you have an infection. They should see their doctor for treatment.  Practice safe sex. Use condoms. Have only one sex partner. GET HELP IF:  You are not getting better after 3 days of treatment.  You have more grey fluid (discharge) coming from your vagina than before.  You have more pain than before.  You have a fever. MAKE SURE YOU:   Understand these instructions.  Will watch your condition.  Will get help right away if you are not doing well or get worse. Document Released: 07/08/2008 Document Revised: 07/20/2013 Document Reviewed: 05/11/2013 [facility] Patient Information 2015 [place], [place]. This information is not intended to replace advice given to you by your health care provider. Make sure you discuss any questions you have with your health care provider. Prenatal Care  WHAT IS PRENATAL CARE?  Prenatal care means health care during your pregnancy, before your baby is born. It is very important to take care of yourself and your baby during your pregnancy by:   Getting early prenatal care. If you know you are pregnant, or think you might be pregnant, call your health care provider as soon as possible. Schedule a visit for a prenatal exam.  Getting regular prenatal care. Follow your health care provider's schedule for blood and other necessary tests. Do not miss appointments.  Doing everything you can to keep yourself and your baby  healthy during your pregnancy.  Getting complete care. Prenatal care should include evaluation of the medical, dietary, educational, psychological, and social needs of you and your significant other. The medical and genetic history of your family and the family of your baby's father should be discussed with your health care provider.  Discussing with your health care provider:  Prescription, over-the-counter, and herbal medicines that you take.  Any history of substance abuse, alcohol use, smoking, and illegal drug use.  Any history of domestic abuse and violence.  Immunizations you have received.  Your nutrition and diet.  The amount of exercise you do.  Any environmental and occupational hazards to which you are exposed.  History of sexually transmitted infections for both you and your partner.  Previous pregnancies you have had. WHY IS PRENATAL CARE SO IMPORTANT?  By regularly seeing your health care provider, you help ensure that problems can be identified early so that they can be treated as soon as possible. Other problems might be prevented. Many studies have shown that early and regular prenatal care is important for the health of mothers and their babies.  HOW CAN I TAKE CARE OF MYSELF WHILE I AM PREGNANT?  Here are ways to take care of yourself and your baby:   Start or continue taking your multivitamin with 400 micrograms (mcg) of folic acid every day.  Get early and regular prenatal care. It is very important to see a health care provider during your pregnancy. Your health care provider will check at each visit  to make sure that you and your baby are healthy. If there are any problems, action can be taken right away to help you and your baby.  Eat a healthy diet that includes:  Fruits.  Vegetables.  Foods low in saturated fat.  Whole grains.  Calcium-rich foods, such as milk, yogurt, and hard cheeses.  Drink 6-8 glasses of liquids a day.  Unless your health  care provider tells you not to, try to be physically active for 30 minutes, most days of the week. If you are pressed for time, you can get your activity in through 10-minute segments, three times a day.  Do not smoke, drink alcohol, or use drugs. These can cause long-term damage to your baby. Talk with your health care provider about steps to take to stop smoking. Talk with a member of your faith community, a counselor, a trusted friend, or your health care provider if you are concerned about your alcohol or drug use.  Ask your health care provider before taking any medicine, even over-the-counter medicines. Some medicines are not safe to take during pregnancy.  Get plenty of rest and sleep.  Avoid hot tubs and saunas during pregnancy.  Do not have X-rays taken unless absolutely necessary and with the recommendation of your health care provider. A lead shield can be placed on your abdomen to protect your baby when X-rays are taken in other parts of your body.  Do not empty the cat litter when you are pregnant. It may contain a parasite that causes an infection called toxoplasmosis, which can cause birth defects. Also, use gloves when working in garden areas used by cats.  Do not eat uncooked or undercooked meats or fish.  Do not eat soft, mold-ripened cheeses (Brie, Camembert, and chevre) or soft, blue-veined cheese (Danish blue and Roquefort).  Stay away from toxic chemicals like:  Insecticides.  Solvents (some cleaners or paint thinners).  Lead.  Mercury.  Sexual intercourse may continue until the end of the pregnancy, unless you have a medical problem or there is a problem with the pregnancy and your health care provider tells you not to.  Do not wear high-heel shoes, especially during the second half of the pregnancy. You can lose your balance and fall.  Do not take long trips, unless absolutely necessary. Be sure to see your health care provider before going on the trip.  Do  not sit in one position for more than 2 hours when on a trip.  Take a copy of your medical records when going on a trip. Know where a hospital is located in the city you are visiting, in case of an emergency.  Most dangerous household products will have pregnancy warnings on their labels. Ask your health care provider about products if you are unsure.  Limit or eliminate your caffeine intake from coffee, tea, sodas, medicines, and chocolate.  Many women continue working through pregnancy. Staying active might help you stay healthier. If you have a question about the safety or the hours you work at your particular job, talk with your health care provider.  Get informed:  Read books.  Watch videos.  Go to childbirth classes for you and your significant other.  Talk with experienced moms.  Ask your health care provider about childbirth education classes for you and your partner. Classes can help you and your partner prepare for the birth of your baby.  Ask about a baby doctor (pediatrician) and methods and pain medicine for labor, delivery, and possible  cesarean delivery. HOW OFTEN SHOULD I SEE MY HEALTH CARE PROVIDER DURING PREGNANCY?  Your health care provider will give you a schedule for your prenatal visits. You will have visits more often as you get closer to the end of your pregnancy. An average pregnancy lasts about 40 weeks.  A typical schedule includes visiting your health care provider:   About once each month during your first 6 months of pregnancy.  Every 2 weeks during the next 2 months.  Weekly in the last month, until the delivery date. Your health care provider will probably want to see you more often if:  You are older than 35 years.  Your pregnancy is high risk because you have certain health problems or problems with the pregnancy, such as:  Diabetes.  High blood pressure.  The baby is not growing on schedule, according to the dates of the pregnancy. Your  health care provider will do special tests to make sure you and your baby are not having any serious problems. WHAT HAPPENS DURING PRENATAL VISITS?   At your first prenatal visit, your health care provider will do a physical exam and talk to you about your health history and the health history of your partner and your family. Your health care provider will be able to tell you what date to expect your baby to be born on.  Your first physical exam will include checks of your blood pressure, measurements of your height and weight, and an exam of your pelvic organs. Your health care provider will do a Pap test if you have not had one recently and will do cultures of your cervix to make sure there is no infection.  At each prenatal visit, there will be tests of your blood, urine, blood pressure, weight, and the progress of the baby will be checked.  At your later prenatal visits, your health care provider will check how you are doing and how your baby is developing. You may have a number of tests done as your pregnancy progresses.  Ultrasound exams are often used to check on your baby's growth and health.  You may have more urine and blood tests, as well as special tests, if needed. These may include amniocentesis to examine fluid in the pregnancy sac, stress tests to check how the baby responds to contractions, or a biophysical profile to measure your baby's well-being. Your health care provider will explain the tests and why they are necessary.  You should be tested for high blood sugar (gestational diabetes) between the 24th and 28th weeks of your pregnancy.  You should discuss with your health care provider your plans to breastfeed or bottle-feed your baby.  Each visit is also a chance for you to learn about staying healthy during pregnancy and to ask questions. Document Released: 10/02/2003 Document Revised: 10/04/2013 Document Reviewed: 12/14/2013 [facility] Patient Information 2015 [place],  [place]. This information is not intended to replace advice given to you by your health care provider. Make sure you discuss any questions you have with your health care provider.
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
## 2015-06-06 LAB — RUBELLA SCREEN: Rubella: 1.67 index (ref >0.99)

## 2015-06-06 LAB — HIV ANTIBODY (ROUTINE TESTING W REFLEX): HIV Screen 4th Generation wRfx: NONREACTIVE

## 2015-06-06 LAB — RPR: RPR: NONREACTIVE

## 2015-06-07 LAB — GC/CHLAMYDIA PROBE AMP (~~LOC~~) NOT AT ARMC
CHLAMYDIA, DNA PROBE: NEGATIVE
Neisseria Gonorrhea: POSITIVE — AB

## 2015-06-18 ENCOUNTER — Telehealth (HOSPITAL_COMMUNITY): Payer: Self-pay | Admitting: *Deleted

## 2015-06-25 ENCOUNTER — Inpatient Hospital Stay (HOSPITAL_COMMUNITY)
Admission: AD | Admit: 2015-06-25 | Discharge: 2015-06-26 | Disposition: A | Discharge status: Home / Self Care | Payer: Medicaid Other | Source: Ambulatory Visit | Attending: Family Medicine | Admitting: Family Medicine

## 2015-06-25 DIAGNOSIS — Z3A26 26 weeks gestation of pregnancy: Secondary | ICD-10-CM | POA: Insufficient documentation

## 2015-06-25 DIAGNOSIS — R102 Pelvic and perineal pain: Secondary | ICD-10-CM

## 2015-06-25 DIAGNOSIS — O26892 Other specified pregnancy related conditions, second trimester: Secondary | ICD-10-CM | POA: Diagnosis not present

## 2015-06-25 DIAGNOSIS — O26899 Other specified pregnancy related conditions, unspecified trimester: Secondary | ICD-10-CM

## 2015-06-25 DIAGNOSIS — R1030 Lower abdominal pain, unspecified: Secondary | ICD-10-CM | POA: Insufficient documentation

## 2015-06-25 DIAGNOSIS — O4702 False labor before 37 completed weeks of gestation, second trimester: Secondary | ICD-10-CM

## 2015-06-25 DIAGNOSIS — R109 Unspecified abdominal pain: Secondary | ICD-10-CM | POA: Diagnosis present

## 2015-06-25 LAB — URINALYSIS, ROUTINE W REFLEX MICROSCOPIC
BILIRUBIN URINE: NEGATIVE
Glucose, UA: NEGATIVE mg/dL
HGB URINE DIPSTICK: NEGATIVE
KETONES UR: NEGATIVE mg/dL
Leukocytes, UA: NEGATIVE
NITRITE: NEGATIVE
PROTEIN: NEGATIVE mg/dL
Specific Gravity, Urine: 1.015 (ref 1.005–1.030)
UROBILINOGEN UA: 0.2 mg/dL (ref 0.0–1.0)
pH: 6.5 (ref 5.0–8.0)

## 2015-06-25 NOTE — MAU Note (Signed)
Pt states she has been cramping for 1-2 weeks, worsening over the last 2 days. Since 8pm tonight the cramping has really worsened. Denies bleeding.

## 2015-06-26 ENCOUNTER — Encounter (HOSPITAL_COMMUNITY): Payer: Self-pay | Admitting: *Deleted

## 2015-06-26 DIAGNOSIS — O4702 False labor before 37 completed weeks of gestation, second trimester: Secondary | ICD-10-CM | POA: Diagnosis not present

## 2015-06-26 MED ORDER — IBUPROFEN 600 MG PO TABS: 600.0000 mg | ORAL_TABLET | Freq: Three times a day (TID) | ORAL | Status: DC | PRN

## 2015-06-26 MED ORDER — IBUPROFEN 600 MG PO TABS: 600.0000 mg | ORAL_TABLET | Freq: Four times a day (QID) | ORAL | Status: DC | PRN

## 2015-06-26 MED ORDER — IBUPROFEN 600 MG PO TABS
600.0000 mg | ORAL_TABLET | Freq: Once | ORAL | Status: AC
  Administered 2015-06-26: 600 mg via ORAL
  Filled 2015-06-26: qty 1
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
## 2015-06-26 NOTE — Discharge Instructions (Signed)

## 2015-06-26 NOTE — MAU Note (Signed)
Pt to be evaluated by faculty.

## 2015-06-27 ENCOUNTER — Encounter: Payer: Self-pay | Admitting: Family Medicine

## 2015-06-27 ENCOUNTER — Ambulatory Visit (INDEPENDENT_AMBULATORY_CARE_PROVIDER_SITE_OTHER): Payer: Medicaid Other | Admitting: Family Medicine

## 2015-06-27 ENCOUNTER — Other Ambulatory Visit (HOSPITAL_COMMUNITY)
Admission: RE | Admit: 2015-06-27 | Discharge: 2015-06-27 | Disposition: A | Discharge status: Home / Self Care | Payer: Medicaid Other | Source: Ambulatory Visit | Attending: Family Medicine | Admitting: Family Medicine

## 2015-06-27 VITALS — BP 120/65 | HR 81 | Temp 98.6°F | Wt 136.1 lb

## 2015-06-27 DIAGNOSIS — Z01419 Encounter for gynecological examination (general) (routine) without abnormal findings: Secondary | ICD-10-CM | POA: Insufficient documentation

## 2015-06-27 DIAGNOSIS — Z113 Encounter for screening for infections with a predominantly sexual mode of transmission: Secondary | ICD-10-CM | POA: Insufficient documentation

## 2015-06-27 DIAGNOSIS — O0932 Supervision of pregnancy with insufficient antenatal care, second trimester: Secondary | ICD-10-CM

## 2015-06-27 DIAGNOSIS — O0992 Supervision of high risk pregnancy, unspecified, second trimester: Secondary | ICD-10-CM

## 2015-06-27 DIAGNOSIS — Z1151 Encounter for screening for human papillomavirus (HPV): Secondary | ICD-10-CM

## 2015-06-27 DIAGNOSIS — Z23 Encounter for immunization: Secondary | ICD-10-CM | POA: Diagnosis not present

## 2015-06-27 DIAGNOSIS — O0993 Supervision of high risk pregnancy, unspecified, third trimester: Secondary | ICD-10-CM

## 2015-06-27 DIAGNOSIS — Z124 Encounter for screening for malignant neoplasm of cervix: Secondary | ICD-10-CM | POA: Diagnosis not present

## 2015-06-27 DIAGNOSIS — Z118 Encounter for screening for other infectious and parasitic diseases: Secondary | ICD-10-CM

## 2015-06-27 DIAGNOSIS — O0990 Supervision of high risk pregnancy, unspecified, unspecified trimester: Secondary | ICD-10-CM | POA: Insufficient documentation

## 2015-06-27 LAB — POCT URINALYSIS DIP (DEVICE)
BILIRUBIN URINE: NEGATIVE
Glucose, UA: NEGATIVE mg/dL
HGB URINE DIPSTICK: NEGATIVE
KETONES UR: NEGATIVE mg/dL
NITRITE: NEGATIVE
Protein, ur: NEGATIVE mg/dL
SPECIFIC GRAVITY, URINE: 1.02 (ref 1.005–1.030)
Urobilinogen, UA: 0.2 mg/dL (ref 0.0–1.0)
pH: 7 (ref 5.0–8.0)

## 2015-06-27 LAB — GLUCOSE TOLERANCE, 1 HOUR (50G) W/O FASTING: Glucose, 1 Hour GTT: 82 mg/dL (ref 70–140)

## 2015-06-27 LAB — OB RESULTS CONSOLE GC/CHLAMYDIA: Gonorrhea: NEGATIVE

## 2015-06-27 MED ORDER — TETANUS-DIPHTH-ACELL PERTUSSIS 5-2.5-18.5 LF-MCG/0.5 IM SUSP
0.5000 mL | Freq: Once | INTRAMUSCULAR | Status: AC
Start: 2015-06-27 — End: 2015-06-27
  Administered 2015-06-27: 0.5 mL via INTRAMUSCULAR

## 2015-06-27 NOTE — Progress Notes (Signed)
Subjective:  Rhonda Blanchard is a 29 y.o. G3P1011 at [redacted]w[redacted]d being seen today for ongoing prenatal care.  Patient reports cramping, mostly when she take flagyl.  Contractions: Not present.  Vag. Bleeding: None. Movement: Present. Denies leaking of fluid.   Gonorrhea: Took azithryo and rocephin. Partner was treated (not FOB) BV: still had 2 days of treatment.   The following portions of the patient's history were reviewed and updated as appropriate: allergies, current medications, past family history, past medical history, past social history, past surgical history and problem list.   Objective:   Filed Vitals:   06/27/15 0811  BP: 120/65  Pulse: 81  Temp: 98.6 F (37 C)  Weight: 136 lb 1.6 oz (61.735 kg)    Fetal Status: Fetal Heart Rate (bpm): 158 Fundal Height: 28 cm Movement: Present     General:  Alert, oriented and cooperative. Patient is in no acute distress.  Skin: Skin is warm and dry. No rash noted.   Cardiovascular: Normal heart rate noted  Respiratory: Normal respiratory effort, no problems with respiration noted  Abdomen: Soft, gravid, appropriate for gestational age. Pain/Pressure: Present     Pelvic: Vag. Bleeding: None     Cervical exam performed        Extremities: Normal range of motion.  Edema: None  Mental Status: Normal mood and affect. Normal behavior. Normal judgment and thought content.   Urinalysis: Urine Protein: Negative Urine Glucose: Negative  Assessment and Plan:  Pregnancy: G3P1011 at [redacted]w[redacted]d  1. Late prenatal care, second trimester - Glucose Tolerance, 1 HR (50g) w/o Fasting - Cytology - PAP - GC/Chlamydia probe amp ([place])not at [facility] - Hemoglobinopathy evaluation - ABO AND RH  - Prescription Monitoring Profile (17)-[facility] - Culture, OB Urine - RPR -HIV  2. Needs flu shot - Flu Vaccine QUAD 36+ mos IM  3. Need for Tdap vaccination - Tdap (BOOSTRIX) injection 0.5 mL; Inject 0.5 mLs into the muscle once.  4. Supervision of high  risk pregnancy, antepartum, third trimester Reviewed prior pregnancy complications Updated [facility] box Provided handout on contraceptive methods  5. Gonorrhea- POS 8/23, treated in ED - TOC today  Term labor symptoms and general obstetric precautions including but not limited to vaginal bleeding, contractions, leaking of fluid and fetal movement were reviewed in detail with the patient. Please refer to After Visit Summary for other counseling recommendations.  Return in about 4 weeks (around 07/25/2015) for Routine prenatal care.   Federico Flake, MD

## 2015-06-27 NOTE — Progress Notes (Signed)
Initial prenatal education packet given Breastfeeding tip of the week reviewed Tdap, flu today Labs today

## 2015-06-27 NOTE — Patient Instructions (Signed)
Third Trimester of Pregnancy The third trimester is from week 29 through week 42, months 7 through 9. The third trimester is a time when the fetus is growing rapidly. At the end of the ninth month, the fetus is about 20 inches in length and weighs 6-10 pounds.  BODY CHANGES Your body goes through many changes during pregnancy. The changes vary from woman to woman.   Your weight will continue to increase. You can expect to gain 25-35 pounds (11-16 kg) by the end of the pregnancy.  You may begin to get stretch marks on your hips, abdomen, and breasts.  You may urinate more often because the fetus is moving lower into your pelvis and pressing on your bladder.  You may develop or continue to have heartburn as a result of your pregnancy.  You may develop constipation because certain hormones are causing the muscles that push waste through your intestines to slow down.  You may develop hemorrhoids or swollen, bulging veins (varicose veins).  You may have pelvic pain because of the weight gain and pregnancy hormones relaxing your joints between the bones in your pelvis. Backaches may result from overexertion of the muscles supporting your posture.  You may have changes in your hair. These can include thickening of your hair, rapid growth, and changes in texture. Some women also have hair loss during or after pregnancy, or hair that feels dry or thin. Your hair will most likely return to normal after your baby is born.  Your breasts will continue to grow and be tender. A yellow discharge may leak from your breasts called colostrum.  Your belly button may stick out.  You may feel short of breath because of your expanding uterus.  You may notice the fetus "dropping," or moving lower in your abdomen.  You may have a bloody mucus discharge. This usually occurs a few days to a week before labor begins.  Your cervix becomes thin and soft (effaced) near your due date. WHAT TO EXPECT AT YOUR PRENATAL  EXAMS  You will have prenatal exams every 2 weeks until week 36. Then, you will have weekly prenatal exams. During a routine prenatal visit:  You will be weighed to make sure you and the fetus are growing normally.  Your blood pressure is taken.  Your abdomen will be measured to track your baby's growth.  The fetal heartbeat will be listened to.  Any test results from the previous visit will be discussed.  You may have a cervical check near your due date to see if you have effaced. At around 36 weeks, your caregiver will check your cervix. At the same time, your caregiver will also perform a test on the secretions of the vaginal tissue. This test is to determine if a type of bacteria, Group B streptococcus, is present. Your caregiver will explain this further. Your caregiver may ask you:  What your birth plan is.  How you are feeling.  If you are feeling the baby move.  If you have had any abnormal symptoms, such as leaking fluid, bleeding, severe headaches, or abdominal cramping.  If you have any questions. Other tests or screenings that may be performed during your third trimester include:  Blood tests that check for low iron levels (anemia).  Fetal testing to check the health, activity level, and growth of the fetus. Testing is done if you have certain medical conditions or if there are problems during the pregnancy. FALSE LABOR You may feel small, irregular contractions that   eventually go away. These are called Braxton Hicks contractions, or false labor. Contractions may last for hours, days, or even weeks before true labor sets in. If contractions come at regular intervals, intensify, or become painful, it is best to be seen by your caregiver.  SIGNS OF LABOR   Menstrual-like cramps.  Contractions that are 5 minutes apart or less.  Contractions that start on the top of the uterus and spread down to the lower abdomen and back.  A sense of increased pelvic pressure or back  pain.  A watery or bloody mucus discharge that comes from the vagina. If you have any of these signs before the 37th week of pregnancy, call your caregiver right away. You need to go to the hospital to get checked immediately. HOME CARE INSTRUCTIONS   Avoid all smoking, herbs, alcohol, and unprescribed drugs. These chemicals affect the formation and growth of the baby.  Follow your caregiver's instructions regarding medicine use. There are medicines that are either safe or unsafe to take during pregnancy.  Exercise only as directed by your caregiver. Experiencing uterine cramps is a good sign to stop exercising.  Continue to eat regular, healthy meals.  Wear a good support bra for breast tenderness.  Do not use hot tubs, steam rooms, or saunas.  Wear your seat belt at all times when driving.  Avoid raw meat, uncooked cheese, cat litter boxes, and soil used by cats. These carry germs that can cause birth defects in the baby.  Take your prenatal vitamins.  Try taking a stool softener (if your caregiver approves) if you develop constipation. Eat more high-fiber foods, such as fresh vegetables or fruit and whole grains. Drink plenty of fluids to keep your urine clear or pale yellow.  Take warm sitz baths to soothe any pain or discomfort caused by hemorrhoids. Use hemorrhoid cream if your caregiver approves.  If you develop varicose veins, wear support hose. Elevate your feet for 15 minutes, 3-4 times a day. Limit salt in your diet.  Avoid heavy lifting, wear low heal shoes, and practice good posture.  Rest a lot with your legs elevated if you have leg cramps or low back pain.  Visit your dentist if you have not gone during your pregnancy. Use a soft toothbrush to brush your teeth and be gentle when you floss.  A sexual relationship may be continued unless your caregiver directs you otherwise.  Do not travel far distances unless it is absolutely necessary and only with the approval  of your caregiver.  Take prenatal classes to understand, practice, and ask questions about the labor and delivery.  Make a trial run to the hospital.  Pack your hospital bag.  Prepare the baby's nursery.  Continue to go to all your prenatal visits as directed by your caregiver. SEEK MEDICAL CARE IF:  You are unsure if you are in labor or if your water has broken.  You have dizziness.  You have mild pelvic cramps, pelvic pressure, or nagging pain in your abdominal area.  You have persistent nausea, vomiting, or diarrhea.  You have a bad smelling vaginal discharge.  You have pain with urination. SEEK IMMEDIATE MEDICAL CARE IF:   You have a fever.  You are leaking fluid from your vagina.  You have spotting or bleeding from your vagina.  You have severe abdominal cramping or pain.  You have rapid weight loss or gain.  You have shortness of breath with chest pain.  You notice sudden or extreme swelling   of your face, hands, ankles, feet, or legs.  You have not felt your baby move in over an hour.  You have severe headaches that do not go away with medicine.  You have vision changes. Document Released: 09/23/2001 Document Revised: 10/04/2013 Document Reviewed: 11/30/2012 ExitCare Patient Information 2015 [employer]. This information is not intended to replace advice given to you by your health care provider. Make sure you discuss any questions you have with your health care provider.  

## 2015-06-28 LAB — RPR

## 2015-06-28 LAB — GC/CHLAMYDIA PROBE AMP (~~LOC~~) NOT AT ARMC
CHLAMYDIA, DNA PROBE: NEGATIVE
NEISSERIA GONORRHEA: NEGATIVE

## 2015-06-28 LAB — ABO AND RH: Rh Type: POSITIVE

## 2015-06-28 LAB — CULTURE, OB URINE
COLONY COUNT: NO GROWTH
ORGANISM ID, BACTERIA: NO GROWTH

## 2015-06-28 LAB — HIV ANTIBODY (ROUTINE TESTING W REFLEX): HIV 1&2 Ab, 4th Generation: NONREACTIVE

## 2015-06-29 LAB — HEMOGLOBINOPATHY EVALUATION
HEMOGLOBIN OTHER: 0 %
HGB A2 QUANT: 2.8 % (ref 2.2–3.2)
HGB F QUANT: 0 % (ref 0.0–2.0)
HGB S QUANTITAION: 0 %
Hgb A: 97.2 % (ref 96.8–97.8)

## 2015-06-29 LAB — CYTOLOGY - PAP

## 2015-07-01 LAB — CANNABANOIDS (GC/LC/MS), URINE: THC-COOH UR CONFIRM: 101 ng/mL — AB (ref <5)

## 2015-07-02 ENCOUNTER — Telehealth: Payer: Self-pay | Admitting: General Practice

## 2015-07-02 NOTE — Telephone Encounter (Signed)
Called and informed patient of normal labs. Patient verbalized understanding and had no questions

## 2015-07-03 ENCOUNTER — Encounter: Payer: Self-pay | Admitting: Family Medicine

## 2015-07-03 DIAGNOSIS — O99320 Drug use complicating pregnancy, unspecified trimester: Secondary | ICD-10-CM | POA: Insufficient documentation

## 2015-07-03 LAB — PRESCRIPTION MONITORING PROFILE (SOLSTAS)
Amphetamine/Meth: NEGATIVE ng/mL
BENZODIAZEPINE SCREEN, URINE: NEGATIVE ng/mL
BUPRENORPHINE, URINE: NEGATIVE ng/mL
Barbiturate Screen, Urine: NEGATIVE ng/mL
COCAINE METABOLITES: NEGATIVE ng/mL
CREATININE, URINE: 143.16 mg/dL (ref >20.0)
Carisoprodol, Urine: NEGATIVE ng/mL
FENTANYL URINE: NEGATIVE ng/mL
MDMA URINE: NEGATIVE ng/mL
MEPERIDINE UR: NEGATIVE ng/mL
Methadone Screen, Urine: NEGATIVE ng/mL
Nitrites, Initial: NEGATIVE ug/mL
OPIATE SCREEN, URINE: NEGATIVE ng/mL
OXYCODONE SCRN UR: NEGATIVE ng/mL
PH URINE, INITIAL: 7.3 pH (ref 4.5–8.9)
Propoxyphene: NEGATIVE ng/mL
TAPENTADOLUR: NEGATIVE ng/mL
Tramadol Scrn, Ur: NEGATIVE ng/mL
ZOLPIDEM, URINE: NEGATIVE ng/mL

## 2015-07-04 ENCOUNTER — Other Ambulatory Visit (HOSPITAL_COMMUNITY): Payer: Self-pay | Admitting: Medical

## 2015-07-04 ENCOUNTER — Ambulatory Visit (HOSPITAL_COMMUNITY)
Admission: RE | Admit: 2015-07-04 | Discharge: 2015-07-04 | Disposition: A | Discharge status: Home / Self Care | Payer: Medicaid Other | Source: Ambulatory Visit | Attending: Medical | Admitting: Medical

## 2015-07-04 DIAGNOSIS — O0933 Supervision of pregnancy with insufficient antenatal care, third trimester: Secondary | ICD-10-CM

## 2015-07-04 DIAGNOSIS — Z3A27 27 weeks gestation of pregnancy: Secondary | ICD-10-CM

## 2015-07-04 DIAGNOSIS — O99322 Drug use complicating pregnancy, second trimester: Secondary | ICD-10-CM

## 2015-07-04 DIAGNOSIS — Z3689 Encounter for other specified antenatal screening: Secondary | ICD-10-CM

## 2015-07-04 DIAGNOSIS — F1910 Other psychoactive substance abuse, uncomplicated: Secondary | ICD-10-CM | POA: Insufficient documentation

## 2015-07-04 DIAGNOSIS — O0932 Supervision of pregnancy with insufficient antenatal care, second trimester: Secondary | ICD-10-CM | POA: Insufficient documentation

## 2015-07-04 DIAGNOSIS — O283 Abnormal ultrasonic finding on antenatal screening of mother: Secondary | ICD-10-CM | POA: Diagnosis not present

## 2015-07-04 DIAGNOSIS — O99323 Drug use complicating pregnancy, third trimester: Secondary | ICD-10-CM

## 2015-07-04 DIAGNOSIS — O289 Unspecified abnormal findings on antenatal screening of mother: Secondary | ICD-10-CM

## 2015-07-04 DIAGNOSIS — W19XXXA Unspecified fall, initial encounter: Secondary | ICD-10-CM

## 2015-07-04 LAB — CHROMOSOMES ANALYSIS FOR CF

## 2015-07-05 LAB — PARVOVIRUS B19 ANTIBODY, IGG AND IGM
Parovirus B19 IgG Abs: 0.7 index (ref 0.0–0.8)
Parovirus B19 IgM Abs: 0.7 index (ref 0.0–0.8)

## 2015-07-05 LAB — TOXOPLASMA ANTIBODIES- IGG AND  IGM
Toxoplasma Antibody- IgM: <3 AU/mL (ref 0.0–7.9)
Toxoplasma IgG Ratio: <3 IU/mL (ref 0.0–7.1)

## 2015-07-05 LAB — CMV IGM

## 2015-07-05 LAB — CMV ANTIBODY, IGG (EIA)

## 2015-07-05 NOTE — Progress Notes (Signed)
Genetic Counseling  Visit Summary Note  Appointment Date: 07/05/2015 Referred By: Marny Lowenstein, PA-C  Date of Birth: 11/29/1985  Pregnancy history: G3P1011 Estimated Date of Delivery: 09/29/15 Estimated Gestational Age: [redacted]w[redacted]d  Ms.  Rhonda Blanchard was seen for genetic counseling because of the ultrasound finding of echogenic bowel.    In summary:  Discussed the soft marker finding of echogenic bowel  Discussed adjustment in risk for Down syndrome  Patient wanted to have this done  Discussed option of screening for CF  Patient wanted to have this done  Discussed option of viral studies  Patient wanted to have this done  We discussed that an echogenic bowel refers to an area in the fetal intestines that appears brighter, or denser, than the liver and/or bone on ultrasound. Echogenic bowel is detected in approximately 1% of fetuses at the time of a second trimester ultrasound evaluation.  Most of the time, this brightness does not cause any problems and will spontaneously resolve.  Possible causes of an echogenic bowel include undergrowth of the bowel, an obstruction or blockage of the intestines, the fetus swallowing a small amount of blood present in the amniotic fluid, an intrauterine infection, a chromosome condition, cystic fibrosis, or a normal variation in development.   Rhonda Blanchard did not report a history of bleeding. She also denied known exposure to infections during the pregnancy. We briefly reviewed cystic fibrosis (CF) including the typical features and prognosis and the autosomal recessive inheritance of the condition. The carrier frequency in the African American population is approximately 1 in 38.  The presence of echogenic bowel on prenatal ultrasound is associated with an increased risk for CF  We discussed that the presence of echogenic bowel on ultrasound increases the chance for fetal Down syndrome from the patient's baseline chance based on age alone. We  reviewed chromosomes, nondisjunction, and the common features and variable prognosis of Down syndrome.  We also reviewed other aneuploidies including trisomies 13 and 69; however, we discussed that these conditions are less likely given that the remainder of the fetal anatomy was normal by ultrasound.  We reviewed other available screening and diagnostic options including noninvasive prenatal screening (NIPS)/cell free DNA (cfDNA) screening, and amniocentesis.  She was counseled regarding the benefits and limitations of each option.  We reviewed the approximate 1 in 300-500 risk for complications for amniocentesis, including spontaneous pregnancy loss.  After consideration of all the options, she elected to proceed with cell free DNA screening (Panorama), CF screening and titers for CMV, Parvo and Toxoplasmosis. The patient declined amniocentesis at this time. Panorama results will be available in 8-10 days and will be forwarded to your office when we receive them.  She understands that ultrasound and screening cannot rule out all birth defects or genetic syndromes. A follow-up ultrasound was planned in 4 weeks.   Both family histories were reviewed and found to be noncontributory for birth defects, intellectual disability, and known genetic conditions. Without further information regarding the provided family history, an accurate genetic risk cannot be calculated. Further genetic counseling is warranted if more information is obtained.  I counseled Rhonda Blanchard regarding the above risks and available options.  The approximate face-to-face time with the genetic counselor was 35 minutes.  Mady Gemma, MS Certified Genetic Counselor

## 2015-07-06 ENCOUNTER — Telehealth (HOSPITAL_COMMUNITY): Payer: Self-pay | Admitting: *Deleted

## 2015-07-06 NOTE — Telephone Encounter (Signed)
TC to give lab results, pt name and DOB verified.  Lab results reviewed by Dr. Marjo Bicker.  CMV immune, Toxo and Parvo were negative.  Redraw Toxo and Parvo at next appt in 4 weeks.  CF and Panorama pending. Genetic counselor will call with those results.  Pt voices understanding, no questions at this time.

## 2015-07-13 ENCOUNTER — Telehealth (HOSPITAL_COMMUNITY): Payer: Self-pay | Admitting: Genetics

## 2015-07-13 DIAGNOSIS — O98211 Gonorrhea complicating pregnancy, first trimester: Secondary | ICD-10-CM

## 2015-07-13 NOTE — Telephone Encounter (Signed)
Called Raeanne Gathers to discuss her cell free fetal DNA test results.  Ms. KLARE CRISS had Panorama testing through [facility].  Testing was offered because of the ultrasound finding of echogenic bowel.   The patient was identified by name and DOB.  We reviewed that these are within normal limits, showing a less than 1 in 10,000 risk for trisomies 21, 18 and 13, and monosomy X (Turner syndrome).  In addition, the risk for triploidy/vanishing twin and sex chromosome trisomies (47,XXX and 47,XXY) was also low risk.   We reviewed that this testing identifies > 99% of pregnancies with trisomy 70, trisomy 62, sex chromosome trisomies (47,XXX and 47,XXY), and triploidy. The detection rate for trisomy 18 is 96%.  The detection rate for monosomy X is ~92%.  The false positive rate is <0.1% for all conditions. She understands that this testing does not identify all genetic conditions.  All questions were answered to her satisfaction, she was encouraged to call with additional questions or concerns.  Mady Gemma, MS Certified Genetic Counselor

## 2015-07-18 ENCOUNTER — Other Ambulatory Visit (HOSPITAL_COMMUNITY): Payer: Self-pay | Admitting: Family Medicine

## 2015-07-19 ENCOUNTER — Other Ambulatory Visit (HOSPITAL_COMMUNITY): Payer: Self-pay | Admitting: Family Medicine

## 2015-07-25 ENCOUNTER — Ambulatory Visit (INDEPENDENT_AMBULATORY_CARE_PROVIDER_SITE_OTHER): Payer: Medicaid Other | Admitting: Family

## 2015-07-25 VITALS — BP 135/86 | HR 90 | Temp 97.8°F | Wt 143.9 lb

## 2015-07-25 DIAGNOSIS — O0993 Supervision of high risk pregnancy, unspecified, third trimester: Secondary | ICD-10-CM

## 2015-07-25 LAB — POCT URINALYSIS DIP (DEVICE)
Bilirubin Urine: NEGATIVE
GLUCOSE, UA: NEGATIVE mg/dL
Hgb urine dipstick: NEGATIVE
Ketones, ur: NEGATIVE mg/dL
Nitrite: NEGATIVE
Protein, ur: NEGATIVE mg/dL
Specific Gravity, Urine: 1.025 (ref 1.005–1.030)
UROBILINOGEN UA: 0.2 mg/dL (ref 0.0–1.0)
pH: 7 (ref 5.0–8.0)

## 2015-07-25 MED ORDER — TERCONAZOLE 0.4 % VA CREA: 1.0000 | TOPICAL_CREAM | Freq: Every day | VAGINAL | Status: DC

## 2015-07-25 NOTE — Progress Notes (Signed)
Pain- belly tightening   Pressure- lower abd  Pt chooses to bottle feed  UA results small leuk

## 2015-07-25 NOTE — Progress Notes (Signed)
Subjective:  Raeanne GathersLakisha N Asbridge is a 29 y.o. G3P1011 at 6182w4d being seen today for ongoing prenatal care.  Patient reports intermittent abdominal tightening (2-3x/day).  Also reports boil on left buttocks x 4 days.  Denies fever body aches or chills.  +vaginal itchng, declined wet prep.   .  Contractions: Not present.  Vag. Bleeding: None. Movement: Present. Denies leaking of fluid.   The following portions of the patient's history were reviewed and updated as appropriate: allergies, current medications, past family history, past medical history, past social history, past surgical history and problem list. Problem list updated.  Objective:   Filed Vitals:   07/25/15 0823  BP: 135/86  Pulse: 90  Temp: 97.8 F (36.6 C)  Weight: 143 lb 14.4 oz (65.273 kg)    Fetal Status: Fetal Heart Rate (bpm): 137 Fundal Height: 30 cm Movement: Present     General:  Alert, oriented and cooperative. Patient is in no acute distress.  Skin: Skin is warm and dry. +3 cm lesion on left upper buttock, slightly firm   Cardiovascular: Normal heart rate noted  Respiratory: Normal respiratory effort, no problems with respiration noted  Abdomen: Soft, gravid, appropriate for gestational age. Pain/Pressure: Present     Pelvic: Vag. Bleeding: None     Cervical exam deferred        Extremities: Normal range of motion.  Edema: None  Mental Status: Normal mood and affect. Normal behavior. Normal judgment and thought content.   Urinalysis: Urine Protein: Negative Urine Glucose: Negative  Assessment and Plan:  Pregnancy: G3P1011 at 3082w4d  1. Supervision of high risk pregnancy, antepartum, third trimester - Reviewed 1 hr results  2. Skin Lesion - Continue warm compresses - Follow-up in urgent care if no resolution or worsening of symptoms  3.  Vaginal Itching - RX Terazol  Preterm labor symptoms and general obstetric precautions including but not limited to vaginal bleeding, contractions, leaking of fluid and  fetal movement were reviewed in detail with the patient. Please refer to After Visit Summary for other counseling recommendations.  Return in about 2 weeks (around 08/08/2015).   Eino FarberWalidah Kennith GainN Karim, CNM

## 2015-07-25 NOTE — Patient Instructions (Addendum)
[facility] [address] [place], [place] [address] Get Driving Directions Visit our website  Hours of Operation Monday-Thursday: 8 a.m. to 8:30 p.m.  Friday: 8 a.m. to 6 p.m.  Saturday & Sunday: 8 a.m. to 4 p.m. Main: [phone]     AREA PEDIATRIC/FAMILY PRACTICE PHYSICIANS  [facility] [address] [place], [place] [address] Phone - [phone]   Fax - [phone]  [facility] [address] [place], [place]  [address] Phone - [phone]   Fax - [phone]  [facility] [address] [place], [place]  [address] Phone - [phone]   Fax - [phone]  [facility] [address] [place], [place]  [address] Phone - [phone]   Fax - [phone]  [facility] [address], [address] [place], [place]  [address] Phone - [phone]   Fax - [phone]  [facility] [address], [address] [place], [place]  [address] Phone - [phone]   Fax - [phone]  [facility] [address], [address] [place], [place]  [address] Phone - [phone]   Fax - [phone]  [facility] [address] [place], [place]  [address] Phone - [phone]   Fax - [phone]  [facility] [address] [place], [place]  [address] Phone - [phone]   Fax - [phone] [facility] [address] [place], [place]  [address] Phone - [phone]   Fax - [phone]  [facility] [address] [place], [place]  [address] Phone - [phone]   Fax - [phone]  [facility] [address], [address] [place], [place]  [address] Phone - [phone]   Fax - [phone]  [facility] [address], [address] [place], [place]  [address] Phone - [phone]   Fax -  [phone]  [facility] [address], [address] [place], [place]  [address] Phone - [phone]  [facility] [address] [place], [place]  [address] Phone - [phone]   Fax - [phone]  [facility] [address], [address] [place], [place]  [address] Phone - [phone]   Fax - [phone]  [facility] [address] [place], [place]  [address] Phone - [phone]   Fax - [phone]  [facility] [address] [place], [place]  [address] Phone - [phone]   Fax - [phone]   [facility] [address], [address] [place], [place]  [address] Phone - [phone]   Fax - [phone]  [facility] [address], [address] [place], [place]  [address] Phone - [phone]   Fax - [phone]  [facility] RUBIN [address], [address] [place], [place]  [address] Phone - [phone]   Fax - [phone]  [facility] [address], [address] [place], [place]  [address] Phone - [phone]   Fax - [phone]  [facility] [address] [place], [place]  [address] Phone - [phone]   Fax - [phone] [facility] [place] [address] [place], [place]  [address] Phone - [phone]   Fax - [phone]  [facility] [address] [place], [place]  [address] Phone - [phone]   Fax - [phone]  [facility] [place] [address] [place],  [address] [place], [place]  [address] Phone - [phone]   Fax - [phone]

## 2015-08-01 ENCOUNTER — Telehealth (HOSPITAL_COMMUNITY): Payer: Self-pay | Admitting: Genetics

## 2015-08-01 ENCOUNTER — Other Ambulatory Visit (HOSPITAL_COMMUNITY): Payer: Self-pay | Admitting: Family

## 2015-08-01 NOTE — Telephone Encounter (Signed)
Called [name] to review the normal results of her cystic fibrosis carrier screening.  Patient was identified by name and date of birth, after initially refusing to disclose either to me.  Discussed that these test results did not identify a change in the CF gene which reduces, but does not eliminate the chance for her to be a carrier for cystic fibrosis.  Thus, CF would be an unlikely explanation for the echogenic bowel seen on previous ultrasounds.  All of her questions were answered to her satisfaction.  She has the contact information for our office should additional questions or concerns arise.  Mady Gemmaaragh Tresa Jolley, MS Certified Genetic Counselor

## 2015-08-02 ENCOUNTER — Other Ambulatory Visit (HOSPITAL_COMMUNITY): Payer: Medicaid Other

## 2015-08-02 ENCOUNTER — Ambulatory Visit (HOSPITAL_COMMUNITY): Payer: Medicaid Other | Attending: Obstetrics & Gynecology

## 2015-08-08 ENCOUNTER — Ambulatory Visit (INDEPENDENT_AMBULATORY_CARE_PROVIDER_SITE_OTHER): Payer: Medicaid Other | Admitting: Advanced Practice Midwife

## 2015-08-08 VITALS — BP 128/76 | HR 78 | Temp 98.2°F | Wt 147.1 lb

## 2015-08-08 DIAGNOSIS — O4703 False labor before 37 completed weeks of gestation, third trimester: Secondary | ICD-10-CM

## 2015-08-08 DIAGNOSIS — O358XX1 Maternal care for other (suspected) fetal abnormality and damage, fetus 1: Secondary | ICD-10-CM | POA: Diagnosis not present

## 2015-08-08 DIAGNOSIS — O358XX0 Maternal care for other (suspected) fetal abnormality and damage, not applicable or unspecified: Secondary | ICD-10-CM | POA: Insufficient documentation

## 2015-08-08 DIAGNOSIS — O35DXX0 Maternal care for other (suspected) fetal abnormality and damage, fetal gastrointestinal anomalies, not applicable or unspecified: Secondary | ICD-10-CM | POA: Insufficient documentation

## 2015-08-08 DIAGNOSIS — O26893 Other specified pregnancy related conditions, third trimester: Secondary | ICD-10-CM

## 2015-08-08 DIAGNOSIS — N898 Other specified noninflammatory disorders of vagina: Secondary | ICD-10-CM | POA: Diagnosis not present

## 2015-08-08 LAB — POCT URINALYSIS DIP (DEVICE)
Bilirubin Urine: NEGATIVE
GLUCOSE, UA: NEGATIVE mg/dL
Hgb urine dipstick: NEGATIVE
NITRITE: NEGATIVE
PROTEIN: NEGATIVE mg/dL
Specific Gravity, Urine: 1.025 (ref 1.005–1.030)
UROBILINOGEN UA: 1 mg/dL (ref 0.0–1.0)
pH: 6.5 (ref 5.0–8.0)

## 2015-08-08 LAB — WET PREP, GENITAL
Clue Cells Wet Prep HPF POC: NONE SEEN
TRICH WET PREP: NONE SEEN
YEAST WET PREP: NONE SEEN

## 2015-08-08 LAB — FETAL FIBRONECTIN: Fetal Fibronectin: NEGATIVE

## 2015-08-08 NOTE — Patient Instructions (Addendum)
Your ultrasound has been rescheduled for Tuesday 08/14/15   Preterm Labor Information Preterm labor is when labor starts at less than 37 weeks of pregnancy. The normal length of a pregnancy is 39 to 41 weeks. CAUSES Often, there is no identifiable underlying cause as to why a woman goes into preterm labor. One of the most common known causes of preterm labor is infection. Infections of the uterus, cervix, vagina, amniotic sac, bladder, kidney, or even the lungs (pneumonia) can cause labor to start. Other suspected causes of preterm labor include:   Urogenital infections, such as yeast infections and bacterial vaginosis.   Uterine abnormalities (uterine shape, uterine septum, fibroids, or bleeding from the placenta).   A cervix that has been operated on (it may fail to stay closed).   Malformations in the fetus.   Multiple gestations (twins, triplets, and so on).   Breakage of the amniotic sac.  RISK FACTORS  Having a previous history of preterm labor.   Having premature rupture of membranes (PROM).   Having a placenta that covers the opening of the cervix (placenta previa).   Having a placenta that separates from the uterus (placental abruption).   Having a cervix that is too weak to hold the fetus in the uterus (incompetent cervix).   Having too much fluid in the amniotic sac (polyhydramnios).   Taking illegal drugs or smoking while pregnant.   Not gaining enough weight while pregnant.   Being younger than 6718 and older than 28 years old.   Having a low socioeconomic status.   Being African American. SYMPTOMS Signs and symptoms of preterm labor include:   Menstrual-like cramps, abdominal pain, or back pain.  Uterine contractions that are regular, as frequent as six in an hour, regardless of their intensity (may be mild or painful).  Contractions that start on the top of the uterus and spread down to the lower abdomen and back.   A sense of increased  pelvic pressure.   A watery or bloody mucus discharge that comes from the vagina.  TREATMENT Depending on the length of the pregnancy and other circumstances, your health care provider may suggest bed rest. If necessary, there are medicines that can be given to stop contractions and to mature the fetal lungs. If labor happens before 34 weeks of pregnancy, a prolonged hospital stay may be recommended. Treatment depends on the condition of both you and the fetus.  WHAT SHOULD YOU DO IF YOU THINK YOU ARE IN PRETERM LABOR? Call your health care provider right away. You will need to go to the hospital to get checked immediately. HOW CAN YOU PREVENT PRETERM LABOR IN FUTURE PREGNANCIES? You should:   Stop smoking if you smoke.  Maintain healthy weight gain and avoid chemicals and drugs that are not necessary.  Be watchful for any type of infection.  Inform your health care provider if you have a known history of preterm labor.   This information is not intended to replace advice given to you by your health care provider. Make sure you discuss any questions you have with your health care provider.   Document Released: 12/20/2003 Document Revised: 06/01/2013 Document Reviewed: 11/01/2012 Elsevier Interactive Patient Education [employer]2016 Elsevier Inc.

## 2015-08-08 NOTE — Progress Notes (Signed)
Breastfeeding tip of the week reviewed. 

## 2015-08-08 NOTE — Progress Notes (Signed)
Subjective:  Rhonda Blanchard is a 29 y.o. G3P1011 at 6360w4d being seen today for ongoing prenatal care.  Patient reports leakign small amount of white and clear discahrge after voiding x several days. and contractions every thiry minutes that have worsened since last night. Denies fever, chills, abd tenderness or VB.  Contractions: Irregular.  Vag. Bleeding: None. Movement: Present. Denies leaking of fluid.   The following portions of the patient's history were reviewed and updated as appropriate: allergies, current medications, past family history, past medical history, past social history, past surgical history and problem list. Problem list updated.  Objective:   Filed Vitals:   08/08/15 1032  BP: 128/76  Pulse: 78  Temp: 98.2 F (36.8 C)  Weight: 147 lb 1.6 oz (66.724 kg)    Fetal Status: Fetal Heart Rate (bpm): 128   Movement: Present     General:  Alert, oriented and cooperative. Patient is in no acute distress.  Skin: Skin is warm and dry. No rash noted.   Cardiovascular: Normal heart rate noted  Respiratory: Normal respiratory effort, no problems with respiration noted  Abdomen: Soft, gravid, appropriate for gestational age. Pain/Pressure: Present     Pelvic: Vag. Bleeding: None Vag D/C Character: thick, white, odorless. Neg pooling  Cervical exam performed      closed/long/-3  Extremities: Normal range of motion.  Edema: None  Mental Status: Normal mood and affect. Normal behavior. Normal judgment and thought content.   Urinalysis: Urine Protein: Negative Urine Glucose: Negative Fern neg Assessment and Plan:  Pregnancy: G3P1011 at 1860w4d  1. Echogenic focus of bowel of fetal affecting antepartum care of mother, fetus 1  - US MFM OB FOLLOW UP; Future  Preterm labor symptoms and general obstetric precautions including but not limited to vaginal bleeding, contractions, leaking of fluid and fetal movement were reviewed in detail with the patient. Please refer to After Visit  Summary for other counseling recommendations.  FFN, wet prep pending.  Return in about 2 weeks (around 08/22/2015).   Dorathy KinsmanVirginia Brain Honeycutt, CNM

## 2015-08-09 ENCOUNTER — Telehealth: Payer: Self-pay | Admitting: General Practice

## 2015-08-09 ENCOUNTER — Telehealth: Payer: Self-pay

## 2015-08-09 NOTE — Telephone Encounter (Signed)
Patient called Rhonda Blanchard back regarding her results. Gave negative results per Rhonda Blanchard patient understood and thanked us for calling her about them, she had no questions.

## 2015-08-09 NOTE — Telephone Encounter (Signed)
Per Rhonda KinsmanVirginia Blanchard, patient's FFN negative. Called patient, no answer- left message stating we are calling with non urgent results, please call us back at the clinics

## 2015-08-10 ENCOUNTER — Ambulatory Visit (HOSPITAL_COMMUNITY): Payer: Medicaid Other

## 2015-08-13 NOTE — Telephone Encounter (Signed)
Called pt and informed her of FFN results and no further instructions or change in plan of care for now. She should keep appt for US tomorrow.  Pt voiced understanding of all information and instructions given.

## 2015-08-14 ENCOUNTER — Ambulatory Visit (HOSPITAL_COMMUNITY)
Admission: RE | Admit: 2015-08-14 | Discharge: 2015-08-14 | Disposition: A | Discharge status: Home / Self Care | Payer: Medicaid Other | Source: Ambulatory Visit | Attending: Advanced Practice Midwife | Admitting: Advanced Practice Midwife

## 2015-08-14 ENCOUNTER — Other Ambulatory Visit (HOSPITAL_COMMUNITY): Payer: Self-pay | Admitting: Obstetrics and Gynecology

## 2015-08-14 ENCOUNTER — Ambulatory Visit (HOSPITAL_COMMUNITY)
Admission: RE | Admit: 2015-08-14 | Discharge: 2015-08-14 | Disposition: A | Discharge status: Home / Self Care | Payer: Medicaid Other | Source: Ambulatory Visit | Attending: Obstetrics & Gynecology | Admitting: Obstetrics & Gynecology

## 2015-08-14 ENCOUNTER — Encounter (HOSPITAL_COMMUNITY): Payer: Self-pay

## 2015-08-14 DIAGNOSIS — Z3A33 33 weeks gestation of pregnancy: Secondary | ICD-10-CM | POA: Insufficient documentation

## 2015-08-14 DIAGNOSIS — O283 Abnormal ultrasonic finding on antenatal screening of mother: Secondary | ICD-10-CM | POA: Insufficient documentation

## 2015-08-14 DIAGNOSIS — O0930 Supervision of pregnancy with insufficient antenatal care, unspecified trimester: Secondary | ICD-10-CM | POA: Insufficient documentation

## 2015-08-14 DIAGNOSIS — O99323 Drug use complicating pregnancy, third trimester: Secondary | ICD-10-CM

## 2015-08-14 DIAGNOSIS — O358XX0 Maternal care for other (suspected) fetal abnormality and damage, not applicable or unspecified: Secondary | ICD-10-CM | POA: Insufficient documentation

## 2015-08-15 ENCOUNTER — Encounter: Payer: Medicaid Other | Admitting: Certified Nurse Midwife

## 2015-08-15 LAB — PARVOVIRUS B19 ANTIBODY, IGG AND IGM
Parovirus B19 IgG Abs: 0.2 index (ref 0.0–0.8)
Parovirus B19 IgM Abs: 0.2 index (ref 0.0–0.8)

## 2015-08-15 LAB — TOXOPLASMA ANTIBODIES- IGG AND  IGM

## 2015-08-20 ENCOUNTER — Encounter: Payer: Self-pay | Admitting: *Deleted

## 2015-08-21 ENCOUNTER — Encounter: Payer: Medicaid Other | Admitting: Advanced Practice Midwife

## 2015-08-22 ENCOUNTER — Telehealth (HOSPITAL_COMMUNITY): Payer: Self-pay | Admitting: *Deleted

## 2015-08-22 ENCOUNTER — Encounter: Payer: Medicaid Other | Admitting: Certified Nurse Midwife

## 2015-08-22 NOTE — Telephone Encounter (Signed)
Called patient with lab results.  No answer.  Left VM that labs are WNL and to call back with any questions.

## 2015-08-28 ENCOUNTER — Ambulatory Visit (INDEPENDENT_AMBULATORY_CARE_PROVIDER_SITE_OTHER): Payer: Medicaid Other | Admitting: Advanced Practice Midwife

## 2015-08-28 ENCOUNTER — Encounter: Payer: Self-pay | Admitting: Advanced Practice Midwife

## 2015-08-28 VITALS — BP 118/66 | HR 99 | Temp 97.8°F | Wt 150.9 lb

## 2015-08-28 DIAGNOSIS — O0993 Supervision of high risk pregnancy, unspecified, third trimester: Secondary | ICD-10-CM | POA: Diagnosis present

## 2015-08-28 LAB — POCT URINALYSIS DIP (DEVICE)
BILIRUBIN URINE: NEGATIVE
Glucose, UA: NEGATIVE mg/dL
HGB URINE DIPSTICK: NEGATIVE
Ketones, ur: NEGATIVE mg/dL
NITRITE: NEGATIVE
Protein, ur: NEGATIVE mg/dL
Specific Gravity, Urine: 1.025 (ref 1.005–1.030)
UROBILINOGEN UA: 1 mg/dL (ref 0.0–1.0)
pH: 6.5 (ref 5.0–8.0)

## 2015-08-28 NOTE — Progress Notes (Signed)
Subjective:  Rhonda Blanchard is a 29 y.o. G3P1011 at 3080w3d being seen today for ongoing prenatal care.  Patient reports Pressure and low back pain.  Contractions: Irregular.  Vag. Bleeding: None. Movement: Present. Denies leaking of fluid.   The following portions of the patient's history were reviewed and updated as appropriate: allergies, current medications, past family history, past medical history, past social history, past surgical history and problem list. Problem list updated.  Objective:   Filed Vitals:   08/28/15 0936  BP: 118/66  Pulse: 99  Temp: 97.8 F (36.6 C)  Weight: 150 lb 14.4 oz (68.448 kg)    Fetal Status: Fetal Heart Rate (bpm): 134   Movement: Present     General:  Alert, oriented and cooperative. Patient is in no acute distress.  Skin: Skin is warm and dry. No rash noted.   Cardiovascular: Normal heart rate noted  Respiratory: Normal respiratory effort, no problems with respiration noted  Abdomen: Soft, gravid, appropriate for gestational age. Pain/Pressure: Present     Pelvic: Vag. Bleeding: None     Cervical exam deferred        Extremities: Normal range of motion.  Edema: None  Mental Status: Normal mood and affect. Normal behavior. Normal judgment and thought content.   Urinalysis:      Assessment and Plan:  Pregnancy: G3P1011 at 4580w3d  There are no diagnoses linked to this encounter. Preterm labor symptoms and general obstetric precautions including but not limited to vaginal bleeding, contractions, leaking of fluid and fetal movement were reviewed in detail with the patient. Please refer to After Visit Summary for other counseling recommendations.   Return to clinic in 1 week  Rhonda Blanchard, CNM

## 2015-08-28 NOTE — Patient Instructions (Signed)

## 2015-08-28 NOTE — Progress Notes (Signed)
Patient reports pelvic pressure and cramping.  Patient adamant that she is not breastfeeding

## 2015-09-04 ENCOUNTER — Encounter: Payer: Medicaid Other | Admitting: Advanced Practice Midwife

## 2015-09-11 ENCOUNTER — Encounter: Payer: Medicaid Other | Admitting: Student

## 2015-09-18 ENCOUNTER — Encounter: Payer: Self-pay | Admitting: Student

## 2015-09-18 ENCOUNTER — Other Ambulatory Visit (HOSPITAL_COMMUNITY)
Admission: RE | Admit: 2015-09-18 | Discharge: 2015-09-18 | Disposition: A | Discharge status: Home / Self Care | Payer: Medicaid Other | Source: Ambulatory Visit | Attending: Student | Admitting: Student

## 2015-09-18 ENCOUNTER — Ambulatory Visit (INDEPENDENT_AMBULATORY_CARE_PROVIDER_SITE_OTHER): Payer: Medicaid Other | Admitting: Student

## 2015-09-18 VITALS — BP 132/70 | HR 87 | Temp 98.5°F | Wt 153.1 lb

## 2015-09-18 DIAGNOSIS — Z113 Encounter for screening for infections with a predominantly sexual mode of transmission: Secondary | ICD-10-CM

## 2015-09-18 DIAGNOSIS — O0993 Supervision of high risk pregnancy, unspecified, third trimester: Secondary | ICD-10-CM | POA: Diagnosis present

## 2015-09-18 LAB — OB RESULTS CONSOLE GBS: STREP GROUP B AG: NEGATIVE

## 2015-09-18 LAB — POCT URINALYSIS DIP (DEVICE)
Bilirubin Urine: NEGATIVE
Glucose, UA: NEGATIVE mg/dL
Hgb urine dipstick: NEGATIVE
NITRITE: NEGATIVE
PH: 7 (ref 5.0–8.0)
PROTEIN: NEGATIVE mg/dL
Specific Gravity, Urine: 1.015 (ref 1.005–1.030)
UROBILINOGEN UA: 1 mg/dL (ref 0.0–1.0)

## 2015-09-18 NOTE — Progress Notes (Signed)
Subjective:  Rhonda Blanchard is a 29 y.o. G3P1011 at 9036w3d being seen today for ongoing prenatal care.  She is currently monitored for the following issues for this high-risk pregnancy and has Gonorrhea affecting pregnancy in first trimester, antepartum; Supervision of high risk pregnancy, antepartum; Drug use affecting pregnancy, antepartum; and Echogenic focus of bowel of fetal affecting antepartum care of mother on her problem list.  Patient reports occasional contractions.  Contractions: Irregular. Vag. Bleeding: None.  Movement: Present. Denies leaking of fluid.   The following portions of the patient's history were reviewed and updated as appropriate: allergies, current medications, past family history, past medical history, past social history, past surgical history and problem list. Problem list updated.  Objective:   Filed Vitals:   09/18/15 0934  BP: 132/70  Pulse: 87  Temp: 98.5 F (36.9 C)  Weight: 153 lb 1.6 oz (69.446 kg)    Fetal Status: Fetal Heart Rate (bpm): 143 Fundal Height: 37 cm Movement: Present  Presentation: Vertex  General:  Alert, oriented and cooperative. Patient is in no acute distress.  Skin: Skin is warm and dry. No rash noted.   Cardiovascular: Normal heart rate noted  Respiratory: Normal respiratory effort, no problems with respiration noted  Abdomen: Soft, gravid, appropriate for gestational age. Pain/Pressure: Present     Pelvic: Vag. Bleeding: None     Cervical exam performed Dilation: 1 Effacement (%): 60 Station: -3  Extremities: Normal range of motion.  Edema: None  Mental Status: Normal mood and affect. Normal behavior. Normal judgment and thought content.   Urinalysis:  protein negative, glucose negative    Assessment and Plan:  Pregnancy: G3P1011 at 6836w3d  1. Supervision of high risk pregnancy, antepartum, third trimester  - Culture, beta strep (group b only) - Urine cytology ancillary only    Term labor symptoms and general  obstetric precautions including but not limited to vaginal bleeding, contractions, leaking of fluid and fetal movement were reviewed in detail with the patient. Please refer to After Visit Summary for other counseling recommendations.  Return in about 1 week (around 09/25/2015).   Judeth HornErin Bilaal Leib, NP

## 2015-09-18 NOTE — Patient Instructions (Signed)
Fetal Movement Counts Patient Name: __________________________________________________ Patient Due Date: ____________________ Performing a fetal movement count is highly recommended in high-risk pregnancies, but it is good for every pregnant woman to do. Your health care provider may ask you to start counting fetal movements at 28 weeks of the pregnancy. Fetal movements often increase:  After eating a full meal.  After physical activity.  After eating or drinking something sweet or cold.  At rest. Pay attention to when you feel the baby is most active. This will help you notice a pattern of your baby's sleep and wake cycles and what factors contribute to an increase in fetal movement. It is important to perform a fetal movement count at the same time each day when your baby is normally most active.  HOW TO COUNT FETAL MOVEMENTS 1. Find a quiet and comfortable area to sit or lie down on your left side. Lying on your left side provides the best blood and oxygen circulation to your baby. 2. Write down the day and time on a sheet of paper or in a journal. 3. Start counting kicks, flutters, swishes, rolls, or jabs in a 2-hour period. You should feel at least 10 movements within 2 hours. 4. If you do not feel 10 movements in 2 hours, wait 2-3 hours and count again. Look for a change in the pattern or not enough counts in 2 hours. SEEK MEDICAL CARE IF:  You feel less than 10 counts in 2 hours, tried twice.  There is no movement in over an hour.  The pattern is changing or taking longer each day to reach 10 counts in 2 hours.  You feel the baby is not moving as he or she usually does. Date: ____________ Movements: ____________ Start time: ____________ Finish time: ____________  Date: ____________ Movements: ____________ Start time: ____________ Finish time: ____________ Date: ____________ Movements: ____________ Start time: ____________ Finish time: ____________ Date: ____________ Movements:  ____________ Start time: ____________ Finish time: ____________ Date: ____________ Movements: ____________ Start time: ____________ Finish time: ____________ Date: ____________ Movements: ____________ Start time: ____________ Finish time: ____________ Date: ____________ Movements: ____________ Start time: ____________ Finish time: ____________ Date: ____________ Movements: ____________ Start time: ____________ Finish time: ____________  Date: ____________ Movements: ____________ Start time: ____________ Finish time: ____________ Date: ____________ Movements: ____________ Start time: ____________ Finish time: ____________ Date: ____________ Movements: ____________ Start time: ____________ Finish time: ____________ Date: ____________ Movements: ____________ Start time: ____________ Finish time: ____________ Date: ____________ Movements: ____________ Start time: ____________ Finish time: ____________ Date: ____________ Movements: ____________ Start time: ____________ Finish time: ____________ Date: ____________ Movements: ____________ Start time: ____________ Finish time: ____________  Date: ____________ Movements: ____________ Start time: ____________ Finish time: ____________ Date: ____________ Movements: ____________ Start time: ____________ Finish time: ____________ Date: ____________ Movements: ____________ Start time: ____________ Finish time: ____________ Date: ____________ Movements: ____________ Start time: ____________ Finish time: ____________ Date: ____________ Movements: ____________ Start time: ____________ Finish time: ____________ Date: ____________ Movements: ____________ Start time: ____________ Finish time: ____________ Date: ____________ Movements: ____________ Start time: ____________ Finish time: ____________  Date: ____________ Movements: ____________ Start time: ____________ Finish time: ____________ Date: ____________ Movements: ____________ Start time: ____________ Finish  time: ____________ Date: ____________ Movements: ____________ Start time: ____________ Finish time: ____________ Date: ____________ Movements: ____________ Start time: ____________ Finish time: ____________ Date: ____________ Movements: ____________ Start time: ____________ Finish time: ____________ Date: ____________ Movements: ____________ Start time: ____________ Finish time: ____________ Date: ____________ Movements: ____________ Start time: ____________ Finish time: ____________  Date: ____________ Movements: ____________ Start time: ____________ Finish   time: ____________ Date: ____________ Movements: ____________ Start time: ____________ Finish time: ____________ Date: ____________ Movements: ____________ Start time: ____________ Finish time: ____________ Date: ____________ Movements: ____________ Start time: ____________ Finish time: ____________ Date: ____________ Movements: ____________ Start time: ____________ Finish time: ____________ Date: ____________ Movements: ____________ Start time: ____________ Finish time: ____________ Date: ____________ Movements: ____________ Start time: ____________ Finish time: ____________  Date: ____________ Movements: ____________ Start time: ____________ Finish time: ____________ Date: ____________ Movements: ____________ Start time: ____________ Finish time: ____________ Date: ____________ Movements: ____________ Start time: ____________ Finish time: ____________ Date: ____________ Movements: ____________ Start time: ____________ Finish time: ____________ Date: ____________ Movements: ____________ Start time: ____________ Finish time: ____________ Date: ____________ Movements: ____________ Start time: ____________ Finish time: ____________ Date: ____________ Movements: ____________ Start time: ____________ Finish time: ____________  Date: ____________ Movements: ____________ Start time: ____________ Finish time: ____________ Date: ____________  Movements: ____________ Start time: ____________ Finish time: ____________ Date: ____________ Movements: ____________ Start time: ____________ Finish time: ____________ Date: ____________ Movements: ____________ Start time: ____________ Finish time: ____________ Date: ____________ Movements: ____________ Start time: ____________ Finish time: ____________ Date: ____________ Movements: ____________ Start time: ____________ Finish time: ____________ Date: ____________ Movements: ____________ Start time: ____________ Finish time: ____________  Date: ____________ Movements: ____________ Start time: ____________ Finish time: ____________ Date: ____________ Movements: ____________ Start time: ____________ Finish time: ____________ Date: ____________ Movements: ____________ Start time: ____________ Finish time: ____________ Date: ____________ Movements: ____________ Start time: ____________ Finish time: ____________ Date: ____________ Movements: ____________ Start time: ____________ Finish time: ____________ Date: ____________ Movements: ____________ Start time: ____________ Finish time: ____________   This information is not intended to replace advice given to you by your health care provider. Make sure you discuss any questions you have with your health care provider.   Document Released: 10/29/2006 Document Revised: 10/20/2014 Document Reviewed: 07/26/2012 Elsevier Interactive Patient Education 2016 Else[employer] Inc[employer] Braxton Hicks Contractions Contractions of the uterus can occur throughout pregnancy. Contractions are not always a sign that you are in labor.  WHAT ARE BRAXTON HICKS CONTRACTIONS?  Contractions that occur before labor are called Braxton Hicks contractions, or false labor. Toward the end of pregnancy (32-34 weeks), these contractions can develop more often and may become more forceful. This is not true labor because these contractions do not result in opening (dilatation) and thinning of  the cervix. They are sometimes difficult to tell apart from true labor because these contractions can be forceful and people have different pain tolerances. You should not feel embarrassed if you go to the hospital with false labor. Sometimes, the only way to tell if you are in true labor is for your health care provider to look for changes in the cervix. If there are no prenatal problems or other health problems associated with the pregnancy, it is completely safe to be sent home with false labor and await the onset of true labor. HOW CAN YOU TELL THE DIFFERENCE BETWEEN TRUE AND FALSE LABOR? False Labor  The contractions of false labor are usually shorter and not as hard as those of true labor.   The contractions are usually irregular.   The contractions are often felt in the front of the lower abdomen and in the groin.   The contractions may go away when you walk around or change positions while lying down.   The contractions get weaker and are shorter lasting as time goes on.   The contractions do not usually become progressively stronger, regular, and closer together as with true labor.  True Labor 5. Contractions in true   labor last 30-70 seconds, become very regular, usually become more intense, and increase in frequency.  6. The contractions do not go away with walking.  7. The discomfort is usually felt in the top of the uterus and spreads to the lower abdomen and low back.  8. True labor can be determined by your health care provider with an exam. This will show that the cervix is dilating and getting thinner.  WHAT TO REMEMBER  Keep up with your usual exercises and follow other instructions given by your health care provider.   Take medicines as directed by your health care provider.   Keep your regular prenatal appointments.   Eat and drink lightly if you think you are going into labor.   If Braxton Hicks contractions are making you uncomfortable:   Change  your position from lying down or resting to walking, or from walking to resting.   Sit and rest in a tub of warm water.   Drink 2-3 glasses of water. Dehydration may cause these contractions.   Do slow and deep breathing several times an hour.  WHEN SHOULD I SEEK IMMEDIATE MEDICAL CARE? Seek immediate medical care if:  Your contractions become stronger, more regular, and closer together.   You have fluid leaking or gushing from your vagina.   You have a fever.   You pass blood-tinged mucus.   You have vaginal bleeding.   You have continuous abdominal pain.   You have low back pain that you never had before.   You feel your baby's head pushing down and causing pelvic pressure.   Your baby is not moving as much as it used to.    This information is not intended to replace advice given to you by your health care provider. Make sure you discuss any questions you have with your health care provider.   Document Released: 09/29/2005 Document Revised: 10/04/2013 Document Reviewed: 07/11/2013 Elsevier Interactive Patient Education 2016 [employer] Inc[employer]  

## 2015-09-18 NOTE — Progress Notes (Signed)
Cultures today Urine: trace ketones, small amt wbcs

## 2015-09-19 LAB — URINE CYTOLOGY ANCILLARY ONLY
Chlamydia: NEGATIVE
Neisseria Gonorrhea: NEGATIVE

## 2015-09-21 LAB — CULTURE, BETA STREP (GROUP B ONLY)

## 2015-09-27 ENCOUNTER — Encounter: Payer: Medicaid Other | Admitting: Obstetrics & Gynecology

## 2015-10-01 ENCOUNTER — Telehealth (HOSPITAL_COMMUNITY): Payer: Self-pay | Admitting: *Deleted

## 2015-10-01 ENCOUNTER — Encounter (HOSPITAL_COMMUNITY): Payer: Self-pay | Admitting: *Deleted

## 2015-10-01 ENCOUNTER — Ambulatory Visit (INDEPENDENT_AMBULATORY_CARE_PROVIDER_SITE_OTHER): Payer: Medicaid Other | Admitting: Family Medicine

## 2015-10-01 VITALS — BP 117/72 | HR 91 | Temp 98.2°F | Wt 158.1 lb

## 2015-10-01 DIAGNOSIS — F1990 Other psychoactive substance use, unspecified, uncomplicated: Secondary | ICD-10-CM

## 2015-10-01 DIAGNOSIS — O480 Post-term pregnancy: Secondary | ICD-10-CM | POA: Diagnosis not present

## 2015-10-01 DIAGNOSIS — O98213 Gonorrhea complicating pregnancy, third trimester: Secondary | ICD-10-CM

## 2015-10-01 DIAGNOSIS — Z36 Encounter for antenatal screening of mother: Secondary | ICD-10-CM

## 2015-10-01 DIAGNOSIS — O98211 Gonorrhea complicating pregnancy, first trimester: Secondary | ICD-10-CM

## 2015-10-01 DIAGNOSIS — O99323 Drug use complicating pregnancy, third trimester: Secondary | ICD-10-CM | POA: Diagnosis not present

## 2015-10-01 DIAGNOSIS — O0993 Supervision of high risk pregnancy, unspecified, third trimester: Secondary | ICD-10-CM

## 2015-10-01 DIAGNOSIS — O358XX0 Maternal care for other (suspected) fetal abnormality and damage, not applicable or unspecified: Secondary | ICD-10-CM

## 2015-10-01 LAB — POCT URINALYSIS DIP (DEVICE)
Bilirubin Urine: NEGATIVE
GLUCOSE, UA: NEGATIVE mg/dL
Hgb urine dipstick: NEGATIVE
KETONES UR: NEGATIVE mg/dL
NITRITE: NEGATIVE
PH: 6.5 (ref 5.0–8.0)
PROTEIN: NEGATIVE mg/dL
Specific Gravity, Urine: 1.025 (ref 1.005–1.030)
UROBILINOGEN UA: 0.2 mg/dL (ref 0.0–1.0)

## 2015-10-01 NOTE — Telephone Encounter (Signed)
Preadmission screen  

## 2015-10-01 NOTE — Progress Notes (Signed)
Subjective:  Rhonda GathersLakisha N Byrom is a 29 y.o. G3P1011 at 560w2d being seen today for ongoing prenatal care.  She is currently monitored for the following issues for this low-risk pregnancy and has Gonorrhea affecting pregnancy in first trimester, antepartum; Supervision of high risk pregnancy, antepartum; Drug use affecting pregnancy, antepartum; and Echogenic focus of bowel of fetal affecting antepartum care of mother on her problem list.  Patient reports no complaints.  Contractions: Irregular. Vag. Bleeding: None.  Movement: Present. Denies leaking of fluid.   The following portions of the patient's history were reviewed and updated as appropriate: allergies, current medications, past family history, past medical history, past social history, past surgical history and problem list. Problem list updated.  Objective:   Filed Vitals:   10/01/15 1139  BP: 117/72  Pulse: 91  Temp: 98.2 F (36.8 C)  Weight: 158 lb 1.6 oz (71.714 kg)    Fetal Status: Fetal Heart Rate (bpm): 130 Fundal Height: 40 cm Movement: Present  Presentation: Vertex  General:  Alert, oriented and cooperative. Patient is in no acute distress.  Skin: Skin is warm and dry. No rash noted.   Cardiovascular: Normal heart rate noted  Respiratory: Normal respiratory effort, no problems with respiration noted  Abdomen: Soft, gravid, appropriate for gestational age. Pain/Pressure: Present     Pelvic: Vag. Bleeding: None Vag D/C Character: White   Cervical exam performed Dilation: 1.5 Effacement (%): 60 Station: -3  Extremities: Normal range of motion.  Edema: Trace  Mental Status: Normal mood and affect. Normal behavior. Normal judgment and thought content.   Urinalysis: Urine Protein: Negative Urine Glucose: Negative  Assessment and Plan:  Pregnancy: G3P1011 at 2760w2d  1. Drug use affecting pregnancy, antepartum, third trimester +THC  2. Echogenic focus of bowel of fetal affecting antepartum care of mother, not applicable or  unspecified fetus  3. Gonorrhea affecting pregnancy in first trimester, antepartum -negative testing at 36 wk  4. Supervision of high risk pregnancy, antepartum, third trimester -updated box -Scheduled IOL -Swept membranes  Term labor symptoms and general obstetric precautions including but not limited to vaginal bleeding, contractions, leaking of fluid and fetal movement were reviewed in detail with the patient. Please refer to After Visit Summary for other counseling recommendations.  Return in about 6 weeks (around 11/12/2015) for PP visit.  IOL on 12/24.Federico Flake.   Mical Brun Niles Hilarie Sinha, MD

## 2015-10-01 NOTE — Patient Instructions (Signed)
Labor Induction Labor induction is when steps are taken to cause a pregnant woman to begin the labor process. Most women go into labor on their own between 37 weeks and 42 weeks of the pregnancy. When this does not happen or when there is a medical need, methods may be used to induce labor. Labor induction causes a pregnant woman's uterus to contract. It also causes the cervix to soften (ripen), open (dilate), and thin out (efface). Usually, labor is not induced before 39 weeks of the pregnancy unless there is a problem with the baby or mother.  Before inducing labor, your health care provider will consider a number of factors, including the following:  The medical condition of you and the baby.   How many weeks along you are.   The status of the baby's lung maturity.   The condition of the cervix.   The position of the baby.  WHAT ARE THE REASONS FOR LABOR INDUCTION? Labor may be induced for the following reasons:  The health of the baby or mother is at risk.   The pregnancy is overdue by 1 week or more.   The water breaks but labor does not start on its own.   The mother has a health condition or serious illness, such as high blood pressure, infection, placental abruption, or diabetes.  The amniotic fluid amounts are low around the baby.   The baby is distressed.  Convenience or wanting the baby to be born on a certain date is not a reason for inducing labor. WHAT METHODS ARE USED FOR LABOR INDUCTION? Several methods of labor induction may be used, such as:   Prostaglandin medicine. This medicine causes the cervix to dilate and ripen. The medicine will also start contractions. It can be taken by mouth or by inserting a suppository into the vagina.   Inserting a thin tube (catheter) with a balloon on the end into the vagina to dilate the cervix. Once inserted, the balloon is expanded with water, which causes the cervix to open.   Stripping the membranes. Your health  care provider separates amniotic sac tissue from the cervix, causing the cervix to be stretched and causing the release of a hormone called progesterone. This may cause the uterus to contract. It is often done during an office visit. You will be sent home to wait for the contractions to begin. You will then come in for an induction.   Breaking the water. Your health care provider makes a hole in the amniotic sac using a small instrument. Once the amniotic sac breaks, contractions should begin. This may still take hours to see an effect.   Medicine to trigger or strengthen contractions. This medicine is given through an IV access tube inserted into a vein in your arm.  All of the methods of induction, besides stripping the membranes, will be done in the hospital. Induction is done in the hospital so that you and the baby can be carefully monitored.  HOW LONG DOES IT TAKE FOR LABOR TO BE INDUCED? Some inductions can take up to 2-3 days. Depending on the cervix, it usually takes less time. It takes longer when you are induced early in the pregnancy or if this is your first pregnancy. If a mother is still pregnant and the induction has been going on for 2-3 days, either the mother will be sent home or a cesarean delivery will be needed. WHAT ARE THE RISKS ASSOCIATED WITH LABOR INDUCTION? Some of the risks of induction include:     Changes in fetal heart rate, such as too high, too low, or erratic.   Fetal distress.   Chance of infection for the mother and baby.   Increased chance of having a cesarean delivery.   Breaking off (abruption) of the placenta from the uterus (rare).   Uterine rupture (very rare).  When induction is needed for medical reasons, the benefits of induction may outweigh the risks. WHAT ARE SOME REASONS FOR NOT INDUCING LABOR? Labor induction should not be done if:   It is shown that your baby does not tolerate labor.   You have had previous surgeries on your  uterus, such as a myomectomy or the removal of fibroids.   Your placenta lies very low in the uterus and blocks the opening of the cervix (placenta previa).   Your baby is not in a head-down position.   The umbilical cord drops down into the birth canal in front of the baby. This could cut off the baby's blood and oxygen supply.   You have had a previous cesarean delivery.   There are unusual circumstances, such as the baby being extremely premature.    This information is not intended to replace advice given to you by your health care provider. Make sure you discuss any questions you have with your health care provider.   Document Released: 02/18/2007 Document Revised: 10/20/2014 Document Reviewed: 04/28/2013 Elsevier Interactive Patient Education 2016 [employer] Inc[employer]  

## 2015-10-06 ENCOUNTER — Inpatient Hospital Stay (HOSPITAL_COMMUNITY)
Admission: RE | Admit: 2015-10-06 | Discharge: 2015-10-08 | DRG: 775 | Disposition: A | Discharge status: Home / Self Care | Payer: Medicaid Other | Source: Ambulatory Visit | Attending: Obstetrics and Gynecology | Admitting: Obstetrics and Gynecology

## 2015-10-06 ENCOUNTER — Inpatient Hospital Stay (HOSPITAL_COMMUNITY): Payer: Medicaid Other | Admitting: Anesthesiology

## 2015-10-06 ENCOUNTER — Encounter (HOSPITAL_COMMUNITY): Payer: Self-pay

## 2015-10-06 DIAGNOSIS — Z8249 Family history of ischemic heart disease and other diseases of the circulatory system: Secondary | ICD-10-CM

## 2015-10-06 DIAGNOSIS — O9902 Anemia complicating childbirth: Secondary | ICD-10-CM | POA: Diagnosis present

## 2015-10-06 DIAGNOSIS — Z87891 Personal history of nicotine dependence: Secondary | ICD-10-CM

## 2015-10-06 DIAGNOSIS — F1210 Cannabis abuse, uncomplicated: Secondary | ICD-10-CM | POA: Diagnosis present

## 2015-10-06 DIAGNOSIS — Z833 Family history of diabetes mellitus: Secondary | ICD-10-CM | POA: Diagnosis not present

## 2015-10-06 DIAGNOSIS — Z3A41 41 weeks gestation of pregnancy: Secondary | ICD-10-CM | POA: Diagnosis not present

## 2015-10-06 DIAGNOSIS — D649 Anemia, unspecified: Secondary | ICD-10-CM | POA: Diagnosis present

## 2015-10-06 DIAGNOSIS — O99324 Drug use complicating childbirth: Secondary | ICD-10-CM | POA: Diagnosis present

## 2015-10-06 DIAGNOSIS — O480 Post-term pregnancy: Secondary | ICD-10-CM | POA: Diagnosis present

## 2015-10-06 LAB — CBC
HCT: 28.8 % — ABNORMAL LOW (ref 36.0–46.0)
Hemoglobin: 9.2 g/dL — ABNORMAL LOW (ref 12.0–15.0)
MCH: 27.8 pg (ref 26.0–34.0)
MCHC: 31.9 g/dL (ref 30.0–36.0)
MCV: 87 fL (ref 78.0–100.0)
PLATELETS: 174 10*3/uL (ref 150–400)
RBC: 3.31 MIL/uL — ABNORMAL LOW (ref 3.87–5.11)
RDW: 14.9 % (ref 11.5–15.5)
WBC: 8.6 10*3/uL (ref 4.0–10.5)

## 2015-10-06 LAB — RPR: RPR Ser Ql: NONREACTIVE

## 2015-10-06 LAB — MRSA PCR SCREENING: MRSA by PCR: NEGATIVE

## 2015-10-06 LAB — TYPE AND SCREEN
ABO/RH(D): O POS
Antibody Screen: NEGATIVE

## 2015-10-06 MED ORDER — CITRIC ACID-SODIUM CITRATE 334-500 MG/5ML PO SOLN: 30.0000 mL | ORAL | Status: DC | PRN

## 2015-10-06 MED ORDER — LACTATED RINGERS IV SOLN
500.0000 mL | INTRAVENOUS | Status: DC | PRN
  Administered 2015-10-06: 500 mL via INTRAVENOUS

## 2015-10-06 MED ORDER — OXYTOCIN 40 UNITS IN LACTATED RINGERS INFUSION - SIMPLE MED: 62.5000 mL/h | INTRAVENOUS | Status: DC

## 2015-10-06 MED ORDER — MISOPROSTOL 25 MCG QUARTER TABLET
25.0000 ug | ORAL_TABLET | ORAL | Status: DC | PRN
  Administered 2015-10-06: 25 ug via VAGINAL
  Filled 2015-10-06: qty 0.25
  Filled 2015-10-06: qty 1

## 2015-10-06 MED ORDER — ONDANSETRON HCL 4 MG/2ML IJ SOLN: 4.0000 mg | Freq: Four times a day (QID) | INTRAMUSCULAR | Status: DC | PRN

## 2015-10-06 MED ORDER — PHENYLEPHRINE 40 MCG/ML (10ML) SYRINGE FOR IV PUSH (FOR BLOOD PRESSURE SUPPORT)
80.0000 ug | PREFILLED_SYRINGE | INTRAVENOUS | Status: DC | PRN
  Filled 2015-10-06: qty 20
  Filled 2015-10-06: qty 2

## 2015-10-06 MED ORDER — TERBUTALINE SULFATE 1 MG/ML IJ SOLN
0.2500 mg | Freq: Once | INTRAMUSCULAR | Status: DC | PRN
  Filled 2015-10-06: qty 1

## 2015-10-06 MED ORDER — DIPHENHYDRAMINE HCL 50 MG/ML IJ SOLN
12.5000 mg | INTRAMUSCULAR | Status: DC | PRN
  Administered 2015-10-07: 12.5 mg via INTRAVENOUS
  Filled 2015-10-06 (×2): qty 1

## 2015-10-06 MED ORDER — LACTATED RINGERS IV SOLN
INTRAVENOUS | Status: DC
  Administered 2015-10-06: 03:00:00 via INTRAVENOUS

## 2015-10-06 MED ORDER — OXYTOCIN 40 UNITS IN LACTATED RINGERS INFUSION - SIMPLE MED
1.0000 m[IU]/min | INTRAVENOUS | Status: DC
  Administered 2015-10-06: 2 m[IU]/min via INTRAVENOUS
  Filled 2015-10-06: qty 1000

## 2015-10-06 MED ORDER — LIDOCAINE HCL (PF) 1 % IJ SOLN
30.0000 mL | INTRAMUSCULAR | Status: DC | PRN
  Filled 2015-10-06: qty 30

## 2015-10-06 MED ORDER — FENTANYL 2.5 MCG/ML BUPIVACAINE 1/10 % EPIDURAL INFUSION (WH - ANES)
14.0000 mL/h | INTRAMUSCULAR | Status: DC | PRN
  Administered 2015-10-06 – 2015-10-07 (×4): 14 mL/h via EPIDURAL
  Filled 2015-10-06 (×3): qty 125

## 2015-10-06 MED ORDER — LACTATED RINGERS IV SOLN
INTRAVENOUS | Status: DC
  Administered 2015-10-06: 19:00:00 via INTRAUTERINE

## 2015-10-06 MED ORDER — EPHEDRINE 5 MG/ML INJ
10.0000 mg | INTRAVENOUS | Status: DC | PRN
  Filled 2015-10-06: qty 2

## 2015-10-06 MED ORDER — ACETAMINOPHEN 325 MG PO TABS: 650.0000 mg | ORAL_TABLET | ORAL | Status: DC | PRN

## 2015-10-06 MED ORDER — LIDOCAINE HCL (PF) 1 % IJ SOLN
INTRAMUSCULAR | Status: DC | PRN
  Administered 2015-10-06: 3 mL via EPIDURAL
  Administered 2015-10-06: 5 mL via EPIDURAL
  Administered 2015-10-06: 2 mL via EPIDURAL

## 2015-10-06 MED ORDER — OXYTOCIN BOLUS FROM INFUSION
500.0000 mL | INTRAVENOUS | Status: DC
  Administered 2015-10-07: 500 mL via INTRAVENOUS
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
## 2015-10-06 NOTE — Anesthesia Preprocedure Evaluation (Signed)
Anesthesia Evaluation  Patient identified by MRN, date of birth, ID band Patient awake    Reviewed: Allergy & Precautions, NPO status , Patient's Chart, lab work & pertinent test results  History of Anesthesia Complications Negative for: history of anesthetic complications  Airway Mallampati: III  TM Distance: >3 FB Neck ROM: Full    Dental  (+) Teeth Intact, Dental Advisory Given   Pulmonary former smoker,    Pulmonary exam normal breath sounds clear to auscultation       Cardiovascular Exercise Tolerance: Good (-) hypertensionnegative cardio ROS Normal cardiovascular exam Rhythm:Regular Rate:Normal     Neuro/Psych negative neurological ROS  negative psych ROS   GI/Hepatic negative GI ROS, Neg liver ROS,   Endo/Other  negative endocrine ROS  Renal/GU negative Renal ROS     Musculoskeletal negative musculoskeletal ROS (+)   Abdominal   Peds  Hematology  (+) Blood dyscrasia, anemia , Plt 174k   Anesthesia Other Findings Day of surgery medications reviewed with the patient.  Reproductive/Obstetrics (+) Pregnancy                             Anesthesia Physical Anesthesia Plan  ASA: II  Anesthesia Plan: Epidural   Post-op Pain Management:    Induction:   Airway Management Planned:   Additional Equipment:   Intra-op Plan:   Post-operative Plan:   Informed Consent: I have reviewed the patients History and Physical, chart, labs and discussed the procedure including the risks, benefits and alternatives for the proposed anesthesia with the patient or authorized representative who has indicated his/her understanding and acceptance.   Dental advisory given  Plan Discussed with:   Anesthesia Plan Comments: (Patient identified. Risks/Benefits/Options discussed with patient including but not limited to bleeding, infection, nerve damage, paralysis, failed block, incomplete pain  control, headache, blood pressure changes, nausea, vomiting, reactions to medication both or allergic, itching and postpartum back pain. Confirmed with bedside nurse the patient's most recent platelet count. Confirmed with patient that they are not currently taking any anticoagulation, have any bleeding history or any family history of bleeding disorders. Patient expressed understanding and wished to proceed. All questions were answered. )        Anesthesia Quick Evaluation

## 2015-10-06 NOTE — Anesthesia Procedure Notes (Signed)
Epidural Patient location during procedure: OB  Staffing Anesthesiologist: Aaralyn Kil EDWARD Performed by: anesthesiologist   Preanesthetic Checklist Completed: patient identified, pre-op evaluation, timeout performed, IV checked, risks and benefits discussed and monitors and equipment checked  Epidural Patient position: sitting Prep: DuraPrep Patient monitoring: blood pressure and continuous pulse ox Approach: midline Location: L3-L4 Injection technique: LOR air  Needle:  Needle type: Tuohy  Needle gauge: 17 G Needle length: 9 cm Needle insertion depth: 4.5 cm Catheter size: 19 Gauge Catheter at skin depth: 9.5 cm Test dose: negative and Other (1% Lidocaine)  Additional Notes Patient identified.  Risk benefits discussed including failed block, incomplete pain control, headache, nerve damage, paralysis, blood pressure changes, nausea, vomiting, reactions to medication both toxic or allergic, and postpartum back pain.  Patient expressed understanding and wished to proceed.  All questions were answered.  Sterile technique used throughout procedure and epidural site dressed with sterile barrier dressing. No paresthesia or other complications noted. The patient did not experience any signs of intravascular injection such as tinnitus or metallic taste in mouth nor signs of intrathecal spread such as rapid motor block. Please see nursing notes for vital signs. Reason for block:procedure for pain   

## 2015-10-06 NOTE — Progress Notes (Addendum)
LABOR PROGRESS NOTE  Rhonda Blanchard is a 29 y.o. G3P1011 at 1773w0d  admitted for PDIOL.   Subjective: No pain s/p epidural  Objective: BP 127/84 mmHg  Pulse 82  Temp(Src) 98.4 F (36.9 C) (Oral)  Resp 18  Ht 5\' 4"  (1.626 m)  Wt 159 lb (72.122 kg)  BMI 27.28 kg/m2  SpO2 99%  LMP 12/13/2014 or  Filed Vitals:   10/06/15 1700 10/06/15 1728 10/06/15 1731 10/06/15 1733  BP: 137/98  127/84   Pulse: 85 76 103 82  Temp:      TempSrc:      Resp: 18     Height:      Weight:      SpO2:  98%  99%    115/mod/+a/intermittent late and variable decels 7/80/-2  Labs: Lab Results  Component Value Date   WBC 8.6 10/06/2015   HGB 9.2* 10/06/2015   HCT 28.8* 10/06/2015   MCV 87.0 10/06/2015   PLT 174 10/06/2015    Patient Active Problem List   Diagnosis Date Noted  . Post term pregnancy, 41 weeks 10/06/2015  . Echogenic focus of bowel of fetal affecting antepartum care of mother 08/08/2015  . Drug use affecting pregnancy, antepartum 07/03/2015  . Supervision of high risk pregnancy, antepartum 06/27/2015  . Gonorrhea affecting pregnancy in first trimester, antepartum 02/13/2015    Assessment / Plan: 29 y.o. G3P1011 at 4673w0d here for PDIOL.  Labor: have revised cervical exam as now with vertex well applied to cervix the cervix is less stretchy. 7 cm dilation. For intermittent variable and late decels have inserted iupc and will start amnioinfusion. Have also applied FSE to better characterize decels, as external monitors have not given a reliable tracing. Will titrate pit to 200 or greater mvus. Fetal Wellbeing:  Cat 2, amnioinfusion as above Pain Control:  S/p epidural Anticipated MOD:  vaginal  Rhonda BilisNoah B Nonie Lochner, MD 10/06/2015, 6:18 PM

## 2015-10-06 NOTE — Progress Notes (Signed)
LABOR PROGRESS NOTE  Rhonda Blanchard is a 29 y.o. G3P1011 at 2546w0d  admitted for PDIOL.   Subjective: No pain s/p epidural  Objective: BP 133/80 mmHg  Pulse 72  Temp(Src) 98.4 F (36.9 C) (Oral)  Resp 18  Ht 5\' 4"  (1.626 m)  Wt 159 lb (72.122 kg)  BMI 27.28 kg/m2  SpO2 99%  LMP 12/13/2014 or  Filed Vitals:   10/06/15 1501 10/06/15 1531 10/06/15 1601 10/06/15 1631  BP: 125/74 121/65 127/78 133/80  Pulse: 78 84 88 72  Temp:   98.4 F (36.9 C)   TempSrc:   Oral   Resp: 18 18 18    Height:      Weight:      SpO2:        135/mod/+a/variable and late decel following arom 8/90/-1  Labs: Lab Results  Component Value Date   WBC 8.6 10/06/2015   HGB 9.2* 10/06/2015   HCT 28.8* 10/06/2015   MCV 87.0 10/06/2015   PLT 174 10/06/2015    Patient Active Problem List   Diagnosis Date Noted  . Post term pregnancy, 41 weeks 10/06/2015  . Echogenic focus of bowel of fetal affecting antepartum care of mother 08/08/2015  . Drug use affecting pregnancy, antepartum 07/03/2015  . Supervision of high risk pregnancy, antepartum 06/27/2015  . Gonorrhea affecting pregnancy in first trimester, antepartum 02/13/2015    Assessment / Plan: 29 y.o. G3P1011 at 2746w0d here for PDIOL.  Labor: progressing with pitocin and now arom. Cont pitocin Fetal Wellbeing:  Late decel after arom with recovery. Fetal vertex well applied, though copious amniotic fluid. If decels persist will re-examine, stop pitocin, resuscitate Pain Control:  S/p epidural Anticipated MOD:  vaginal  Silvano BilisNoah B Ethelda Deangelo, MD 10/06/2015, 5:09 PM

## 2015-10-07 ENCOUNTER — Encounter (HOSPITAL_COMMUNITY): Payer: Self-pay

## 2015-10-07 DIAGNOSIS — O480 Post-term pregnancy: Secondary | ICD-10-CM

## 2015-10-07 DIAGNOSIS — Z3A41 41 weeks gestation of pregnancy: Secondary | ICD-10-CM

## 2015-10-07 MED ORDER — DIBUCAINE 1 % RE OINT: 1.0000 "application " | TOPICAL_OINTMENT | RECTAL | Status: DC | PRN

## 2015-10-07 MED ORDER — ACETAMINOPHEN 325 MG PO TABS
650.0000 mg | ORAL_TABLET | ORAL | Status: DC | PRN
  Administered 2015-10-07 (×2): 650 mg via ORAL
  Filled 2015-10-07 (×2): qty 2

## 2015-10-07 MED ORDER — TETANUS-DIPHTH-ACELL PERTUSSIS 5-2.5-18.5 LF-MCG/0.5 IM SUSP: 0.5000 mL | Freq: Once | INTRAMUSCULAR | Status: DC

## 2015-10-07 MED ORDER — ONDANSETRON HCL 4 MG/2ML IJ SOLN: 4.0000 mg | INTRAMUSCULAR | Status: DC | PRN

## 2015-10-07 MED ORDER — SENNOSIDES-DOCUSATE SODIUM 8.6-50 MG PO TABS
2.0000 | ORAL_TABLET | ORAL | Status: DC
  Administered 2015-10-07: 2 via ORAL
  Filled 2015-10-07: qty 2

## 2015-10-07 MED ORDER — LANOLIN HYDROUS EX OINT: TOPICAL_OINTMENT | CUTANEOUS | Status: DC | PRN

## 2015-10-07 MED ORDER — OXYTOCIN 40 UNITS IN LACTATED RINGERS INFUSION - SIMPLE MED: 62.5000 mL/h | INTRAVENOUS | Status: DC | PRN

## 2015-10-07 MED ORDER — OXYCODONE-ACETAMINOPHEN 5-325 MG PO TABS
1.0000 | ORAL_TABLET | ORAL | Status: DC | PRN
  Administered 2015-10-07 (×2): 1 via ORAL
  Filled 2015-10-07 (×2): qty 1

## 2015-10-07 MED ORDER — ZOLPIDEM TARTRATE 5 MG PO TABS: 5.0000 mg | ORAL_TABLET | Freq: Every evening | ORAL | Status: DC | PRN

## 2015-10-07 MED ORDER — MEASLES, MUMPS & RUBELLA VAC ~~LOC~~ INJ: 0.5000 mL | INJECTION | Freq: Once | SUBCUTANEOUS | Status: DC

## 2015-10-07 MED ORDER — BENZOCAINE-MENTHOL 20-0.5 % EX AERO
1.0000 "application " | INHALATION_SPRAY | CUTANEOUS | Status: DC | PRN
  Administered 2015-10-07: 1 via TOPICAL
  Filled 2015-10-07 (×2): qty 56

## 2015-10-07 MED ORDER — OXYCODONE-ACETAMINOPHEN 5-325 MG PO TABS
2.0000 | ORAL_TABLET | ORAL | Status: DC | PRN
Start: 2015-10-07 — End: 2015-10-08
  Administered 2015-10-07 – 2015-10-08 (×3): 2 via ORAL
  Filled 2015-10-07 (×3): qty 2

## 2015-10-07 MED ORDER — IBUPROFEN 600 MG PO TABS
600.0000 mg | ORAL_TABLET | Freq: Four times a day (QID) | ORAL | Status: DC
  Administered 2015-10-07 – 2015-10-08 (×6): 600 mg via ORAL
  Filled 2015-10-07 (×6): qty 1

## 2015-10-07 MED ORDER — DIPHENHYDRAMINE HCL 25 MG PO CAPS: 25.0000 mg | ORAL_CAPSULE | Freq: Four times a day (QID) | ORAL | Status: DC | PRN

## 2015-10-07 MED ORDER — PRENATAL MULTIVITAMIN CH
1.0000 | ORAL_TABLET | Freq: Every day | ORAL | Status: DC
  Filled 2015-10-07 (×2): qty 1

## 2015-10-07 MED ORDER — ONDANSETRON HCL 4 MG PO TABS: 4.0000 mg | ORAL_TABLET | ORAL | Status: DC | PRN

## 2015-10-07 MED ORDER — SIMETHICONE 80 MG PO CHEW: 80.0000 mg | CHEWABLE_TABLET | ORAL | Status: DC | PRN

## 2015-10-07 MED ORDER — WITCH HAZEL-GLYCERIN EX PADS
1.0000 "application " | MEDICATED_PAD | CUTANEOUS | Status: DC | PRN
  Administered 2015-10-07: 1 via TOPICAL

## 2015-10-07 NOTE — Anesthesia Postprocedure Evaluation (Signed)
Anesthesia Post Note  Patient: Rhonda Blanchard  Procedure(s) Performed: * No procedures listed *  Patient location during evaluation: Mother Baby Anesthesia Type: Epidural Level of consciousness: awake Pain management: satisfactory to patient Vital Signs Assessment: post-procedure vital signs reviewed and stable Respiratory status: spontaneous breathing Cardiovascular status: stable Anesthetic complications: no    Last Vitals:  Filed Vitals:   10/07/15 0430 10/07/15 0825  BP: 143/74 126/75  Pulse: 89 73  Temp: 36.6 C 37 C  Resp: 18 18    Last Pain:  Filed Vitals:   10/07/15 1412  PainSc: 9                  Aprille Sawhney

## 2015-10-08 MED ORDER — FERROUS SULFATE 325 (65 FE) MG PO TABS: 325.0000 mg | ORAL_TABLET | Freq: Two times a day (BID) | ORAL | Status: DC

## 2015-10-08 MED ORDER — IBUPROFEN 600 MG PO TABS: 600.0000 mg | ORAL_TABLET | Freq: Four times a day (QID) | ORAL | Status: DC

## 2015-10-08 MED ORDER — ACETAMINOPHEN-CODEINE 300-30 MG PO TABS: 1.0000 | ORAL_TABLET | Freq: Four times a day (QID) | ORAL | Status: DC | PRN

## 2015-10-08 NOTE — Discharge Instructions (Signed)
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
## 2015-10-08 NOTE — Discharge Summary (Signed)
OB Discharge Summary     Patient Name: Rhonda Blanchard DOB: 07-Oct-1986 MRN: [id number]  Date of admission: 10/06/2015 Delivering MD: Shonna Chock BEDFORD   Date of discharge: 10/08/2015  Admitting diagnosis: Induction Intrauterine pregnancy: [redacted]w[redacted]d     Secondary diagnosis:  Active Problems:   Post term pregnancy, 41 weeks  Additional problems: None     Discharge diagnosis: Postterm Pregnancy and Anemia                                                                                               Post partum procedures:None  Augmentation: AROM, Pitocin and Foley Balloon  Complications: None  Hospital course:  Induction of Labor With Vaginal Delivery   29 y.o. yo [id number] at [redacted]w[redacted]d was admitted to the hospital 10/06/2015 for induction of labor.  Indication for induction: Postdates.  Patient had an uncomplicated labor course as follows: Membrane Rupture Time/Date: 5:02 PM ,10/06/2015   Intrapartum Procedures: Episiotomy: None [1]                                         Lacerations:  None [1]  Patient had delivery of a Viable infant.  Information for the patient's newborn:  Danisha, Brassfield [[id number]]  Delivery Method: Vaginal, Spontaneous Delivery (Filed from Delivery Summary)   10/07/2015  Details of delivery can be found in separate delivery note.  Patient had a routine postpartum course. Patient is discharged home No discharge date for patient encounter.    Physical exam  Filed Vitals:   10/07/15 0430 10/07/15 0825 10/07/15 1645 10/08/15 0620  BP: 143/74 126/75 142/60 135/84  Pulse: 89 73 77 69  Temp: 97.9 F (36.6 C) 98.6 F (37 C) 98.3 F (36.8 C) 97.9 F (36.6 C)  TempSrc: Oral  Oral   Resp: Height:      Weight:      SpO2: 99%      General: alert, cooperative and appears stated age 18: appropriate Uterine Fundus: firm Incision: n/a DVT Evaluation: No evidence of DVT seen on physical exam. Negative Homan's sign.  Labs: Lab Results   Component Value Date   WBC 8.6 10/06/2015   HGB 9.2* 10/06/2015   HCT 28.8* 10/06/2015   MCV 87.0 10/06/2015   PLT 174 10/06/2015   CMP Latest Ref Rng 08/08/2014  Glucose 70 - 99 mg/dL 93  BUN 6 - 23 mg/dL 9  Creatinine 2.44 - 0.10 mg/dL 2.72  Sodium 536 - 644 mEq/L 145  Potassium 3.7 - 5.3 mEq/L 3.4(L)  Chloride 96 - 112 mEq/L 106  CO2 19 - 32 mEq/L 27  Calcium 8.4 - 10.5 mg/dL 9.2  Total Protein 6.0 - 8.3 g/dL 7.2  Total Bilirubin 0.3 - 1.2 mg/dL 0.3  Alkaline Phos 39 - 117 U/L 33(L)  AST 0 - 37 U/L 14  ALT 0 - 35 U/L 17    Discharge instruction: per After Visit Summary and "Baby and Me Booklet".  After visit meds:  Medication List    TAKE these medications        Acetaminophen-Codeine 300-30 MG tablet  Commonly known as:  TYLENOL/CODEINE #3  Take 1 tablet by mouth every 6 (six) hours as needed for pain.     [employer] PRENATAL GUMMY 0.4-113.5 MG Chew  Chew 2 capsules by mouth daily.     ibuprofen 600 MG tablet  Commonly known as:  ADVIL,MOTRIN  Take 1 tablet (600 mg total) by mouth every 6 (six) hours.        Diet: routine diet  Activity: Advance as tolerated. Pelvic rest for 6 weeks.   Outpatient follow up:6 weeks Follow up Appt:Future Appointments Date Time Provider Department Center  11/12/2015 12:45 PM Kathrynn RunningNoah Bedford Wouk, MD [facility]   Follow up Visit:No Follow-up on file.  Postpartum contraception: Mirena during postpartum visit  Newborn Data: Live born female  Birth Weight: 8 lb 1.5 oz (3671 g) APGAR: 8, 9  Baby Feeding: Bottle Disposition:home with mother   10/08/2015 Marlis EdelsonKARIM, WALIDAH N, CNM

## 2015-10-10 ENCOUNTER — Telehealth: Payer: Self-pay | Admitting: *Deleted

## 2015-10-10 NOTE — Telephone Encounter (Signed)
Pt called and stated that she is concerned she may have an infection in her breast because they hurt and are hot. Pt delivered her infant 3 days ago and is bottle feeding. She states the breasts are swollen and she has a lump under her Lt arm in the armpit area. She denies having hot flashes or chills but has not checked her temperature. I consulted with Rochele PagesWalidah Karim, CNM. I then asked the pt to check her temperature and if it is >100.4, she needs to go to MAU today. If the temperature is <100.4 she may have appt tomorrow in clinic for evaluation.  Pt agreed and voiced understanding of all information

## 2015-10-11 ENCOUNTER — Ambulatory Visit (INDEPENDENT_AMBULATORY_CARE_PROVIDER_SITE_OTHER): Payer: Medicaid Other | Admitting: Obstetrics & Gynecology

## 2015-10-11 VITALS — BP 143/87 | HR 63 | Temp 98.2°F | Resp 18 | Wt 146.0 lb

## 2015-10-11 DIAGNOSIS — N63 Unspecified lump in breast: Secondary | ICD-10-CM | POA: Diagnosis not present

## 2015-10-11 DIAGNOSIS — Q838 Other congenital malformations of breast: Secondary | ICD-10-CM

## 2015-10-11 NOTE — Progress Notes (Signed)
Patient ID: Rhonda Blanchard, female   DOB: Jan 05, 1986, 29 y.o.   MRN: [id number] Pt c/o lump under left breast-arm.

## 2015-10-11 NOTE — Progress Notes (Signed)
   Subjective:    Patient ID: Rhonda Blanchard, female    DOB: 05/18/1986, 29 y.o.   MRN: [id number]  HPI 29 yo lady 4 days s/p NSVD is here with a new onset, non-painful swelling under her left arm. Her breasts are leaking but is not breastfeeding.   Review of Systems     Objective:   Physical Exam [id number] Breathing, conversing, and ambulating normally She has a 2-3 cm firm cystic area in her left axilla, non-tender       Assessment & Plan:  Left axilla mass- I strongly suspect that this is extramammary tissue. I have recommended a tight bra, cold cabbage leaves If this mass does not regress, then we will schedule an u/s

## 2015-11-12 ENCOUNTER — Ambulatory Visit: Payer: Medicaid Other | Admitting: Obstetrics and Gynecology

## 2016-02-21 ENCOUNTER — Ambulatory Visit: Payer: Medicaid Other | Admitting: Family Medicine

## 2016-02-22 ENCOUNTER — Ambulatory Visit: Payer: Medicaid Other | Admitting: Obstetrics & Gynecology

## 2016-06-28 ENCOUNTER — Inpatient Hospital Stay (HOSPITAL_COMMUNITY)
Admission: AD | Admit: 2016-06-28 | Discharge: 2016-06-28 | Disposition: A | Discharge status: Home / Self Care | Payer: Medicaid Other | Source: Ambulatory Visit | Attending: Obstetrics & Gynecology | Admitting: Obstetrics & Gynecology

## 2016-06-28 ENCOUNTER — Encounter (HOSPITAL_COMMUNITY): Payer: Self-pay | Admitting: *Deleted

## 2016-06-28 DIAGNOSIS — Z87891 Personal history of nicotine dependence: Secondary | ICD-10-CM | POA: Diagnosis not present

## 2016-06-28 DIAGNOSIS — N760 Acute vaginitis: Secondary | ICD-10-CM | POA: Diagnosis not present

## 2016-06-28 DIAGNOSIS — A499 Bacterial infection, unspecified: Secondary | ICD-10-CM

## 2016-06-28 DIAGNOSIS — N898 Other specified noninflammatory disorders of vagina: Secondary | ICD-10-CM | POA: Insufficient documentation

## 2016-06-28 DIAGNOSIS — B9689 Other specified bacterial agents as the cause of diseases classified elsewhere: Secondary | ICD-10-CM

## 2016-06-28 LAB — POCT PREGNANCY, URINE: Preg Test, Ur: NEGATIVE

## 2016-06-28 LAB — URINALYSIS, ROUTINE W REFLEX MICROSCOPIC
Bilirubin Urine: NEGATIVE
GLUCOSE, UA: NEGATIVE mg/dL
Hgb urine dipstick: NEGATIVE
Ketones, ur: NEGATIVE mg/dL
NITRITE: NEGATIVE
PH: 6.5 (ref 5.0–8.0)
Protein, ur: NEGATIVE mg/dL
SPECIFIC GRAVITY, URINE: 1.02 (ref 1.005–1.030)

## 2016-06-28 LAB — WET PREP, GENITAL
SPERM: NONE SEEN
TRICH WET PREP: NONE SEEN
YEAST WET PREP: NONE SEEN

## 2016-06-28 LAB — URINE MICROSCOPIC-ADD ON: RBC / HPF: NONE SEEN RBC/hpf (ref 0–5)

## 2016-06-28 MED ORDER — METRONIDAZOLE 500 MG PO TABS: 500.0000 mg | ORAL_TABLET | Freq: Two times a day (BID) | ORAL | 0 refills | Status: DC

## 2016-06-28 NOTE — Discharge Instructions (Signed)

## 2016-06-28 NOTE — MAU Note (Signed)
Pt c/o vag discharge and and odor. Also c/o back pain and abd cramping x 1 week. Had been dx with uti and yeast infection 2 weeks ago. Thinks she has BV now.
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
## 2016-06-30 LAB — GC/CHLAMYDIA PROBE AMP (~~LOC~~) NOT AT ARMC
Chlamydia: NEGATIVE
NEISSERIA GONORRHEA: NEGATIVE

## 2016-10-08 LAB — OB RESULTS CONSOLE ABO/RH: RH Type: POSITIVE

## 2016-10-08 LAB — OB RESULTS CONSOLE GC/CHLAMYDIA
CHLAMYDIA, DNA PROBE: NEGATIVE
GC PROBE AMP, GENITAL: NEGATIVE

## 2016-10-08 LAB — OB RESULTS CONSOLE HIV ANTIBODY (ROUTINE TESTING): HIV: NONREACTIVE

## 2016-10-08 LAB — OB RESULTS CONSOLE HEPATITIS B SURFACE ANTIGEN: Hepatitis B Surface Ag: NEGATIVE

## 2016-10-08 LAB — OB RESULTS CONSOLE RPR: RPR: NONREACTIVE

## 2016-10-08 LAB — OB RESULTS CONSOLE ANTIBODY SCREEN: Antibody Screen: NEGATIVE

## 2016-10-08 LAB — OB RESULTS CONSOLE RUBELLA ANTIBODY, IGM: RUBELLA: IMMUNE

## 2016-10-13 NOTE — L&D Delivery Note (Addendum)
Delivery Note At 6:03 PM a viable female was delivered via Vaginal, Vacuum assisted Delivery (Presentation: vtx; OA ).  APGAR: 9, 0; weight pending.   Placenta status: spontaneous, intact.  Cord:  with the following complications: none.  Vacuum assistance done for FHT 90-100 for the 10 minutes prior to delivery.  She would push well but then stop before crowning.  Risks were discussed and she initially declined vacuum assistance.  She agreed to vacuum assistance when I offered again when the FHT remained mostly 90s.  Kiwi vacuum applied with OA vertex at +2 station, delivery fairly easy with 3 pushes with one ctx, no pop-offs.   Anesthesia:  Epidural Episiotomy: None Lacerations: None Suture Repair: none Est. Blood Loss (mL): 200  Mom to postpartum.  Baby to Couplet care / Skin to Skin.  Leighton Roachodd D Blimi Godby 05/05/2017, 6:15 PM

## 2016-10-21 ENCOUNTER — Other Ambulatory Visit: Payer: Self-pay | Admitting: Obstetrics and Gynecology

## 2016-10-21 DIAGNOSIS — R011 Cardiac murmur, unspecified: Secondary | ICD-10-CM

## 2016-11-05 ENCOUNTER — Ambulatory Visit (HOSPITAL_COMMUNITY): Payer: Medicaid Other | Attending: Cardiology

## 2016-11-05 ENCOUNTER — Other Ambulatory Visit: Payer: Self-pay

## 2016-11-05 DIAGNOSIS — I340 Nonrheumatic mitral (valve) insufficiency: Secondary | ICD-10-CM | POA: Insufficient documentation

## 2016-11-05 DIAGNOSIS — R011 Cardiac murmur, unspecified: Secondary | ICD-10-CM

## 2017-04-10 LAB — OB RESULTS CONSOLE GBS: GBS: NEGATIVE

## 2017-04-30 ENCOUNTER — Encounter (HOSPITAL_COMMUNITY): Payer: Self-pay | Admitting: *Deleted

## 2017-04-30 ENCOUNTER — Telehealth (HOSPITAL_COMMUNITY): Payer: Self-pay | Admitting: *Deleted

## 2017-04-30 NOTE — Telephone Encounter (Signed)
Preadmission screen  

## 2017-05-05 ENCOUNTER — Inpatient Hospital Stay (HOSPITAL_COMMUNITY): Payer: Medicaid Other | Admitting: Anesthesiology

## 2017-05-05 ENCOUNTER — Inpatient Hospital Stay (HOSPITAL_COMMUNITY)
Admission: RE | Admit: 2017-05-05 | Discharge: 2017-05-07 | DRG: 775 | Disposition: A | Discharge status: Home / Self Care | Payer: Medicaid Other | Source: Ambulatory Visit | Attending: Obstetrics and Gynecology | Admitting: Obstetrics and Gynecology

## 2017-05-05 ENCOUNTER — Encounter (HOSPITAL_COMMUNITY): Payer: Self-pay

## 2017-05-05 DIAGNOSIS — Z3A39 39 weeks gestation of pregnancy: Secondary | ICD-10-CM

## 2017-05-05 DIAGNOSIS — Z87891 Personal history of nicotine dependence: Secondary | ICD-10-CM | POA: Diagnosis not present

## 2017-05-05 DIAGNOSIS — Z3493 Encounter for supervision of normal pregnancy, unspecified, third trimester: Secondary | ICD-10-CM | POA: Diagnosis present

## 2017-05-05 LAB — CBC
HEMATOCRIT: 30.4 % — AB (ref 36.0–46.0)
Hemoglobin: 9.5 g/dL — ABNORMAL LOW (ref 12.0–15.0)
MCH: 24.9 pg — AB (ref 26.0–34.0)
MCHC: 31.3 g/dL (ref 30.0–36.0)
MCV: 79.8 fL (ref 78.0–100.0)
Platelets: 201 10*3/uL (ref 150–400)
RBC: 3.81 MIL/uL — ABNORMAL LOW (ref 3.87–5.11)
RDW: 16.5 % — ABNORMAL HIGH (ref 11.5–15.5)
WBC: 8.8 10*3/uL (ref 4.0–10.5)

## 2017-05-05 LAB — TYPE AND SCREEN
ABO/RH(D): O POS
Antibody Screen: NEGATIVE

## 2017-05-05 MED ORDER — LACTATED RINGERS IV SOLN
500.0000 mL | INTRAVENOUS | Status: DC | PRN
Start: 2017-05-05 — End: 2017-05-05

## 2017-05-05 MED ORDER — MAGNESIUM HYDROXIDE 400 MG/5ML PO SUSP: 30.0000 mL | ORAL | Status: DC | PRN

## 2017-05-05 MED ORDER — OXYCODONE HCL 5 MG PO TABS
5.0000 mg | ORAL_TABLET | ORAL | Status: DC | PRN
Start: 2017-05-05 — End: 2017-05-07
  Administered 2017-05-05 – 2017-05-06 (×5): 5 mg via ORAL
  Filled 2017-05-05 (×3): qty 1

## 2017-05-05 MED ORDER — OXYTOCIN 40 UNITS IN LACTATED RINGERS INFUSION - SIMPLE MED
1.0000 m[IU]/min | INTRAVENOUS | Status: DC
  Administered 2017-05-05: 2 m[IU]/min via INTRAVENOUS
  Filled 2017-05-05: qty 1000

## 2017-05-05 MED ORDER — WITCH HAZEL-GLYCERIN EX PADS: 1.0000 "application " | MEDICATED_PAD | CUTANEOUS | Status: DC | PRN

## 2017-05-05 MED ORDER — OXYCODONE HCL 5 MG PO TABS
10.0000 mg | ORAL_TABLET | ORAL | Status: DC | PRN
Start: 2017-05-05 — End: 2017-05-07
  Administered 2017-05-06: 10 mg via ORAL
  Filled 2017-05-05 (×3): qty 2

## 2017-05-05 MED ORDER — METHYLERGONOVINE MALEATE 0.2 MG/ML IJ SOLN: 0.2000 mg | INTRAMUSCULAR | Status: DC | PRN

## 2017-05-05 MED ORDER — SENNOSIDES-DOCUSATE SODIUM 8.6-50 MG PO TABS
2.0000 | ORAL_TABLET | ORAL | Status: DC
  Administered 2017-05-05 – 2017-05-06 (×2): 2 via ORAL
  Filled 2017-05-05 (×2): qty 2

## 2017-05-05 MED ORDER — OXYTOCIN 40 UNITS IN LACTATED RINGERS INFUSION - SIMPLE MED: 2.5000 [IU]/h | INTRAVENOUS | Status: DC

## 2017-05-05 MED ORDER — METHYLERGONOVINE MALEATE 0.2 MG PO TABS: 0.2000 mg | ORAL_TABLET | ORAL | Status: DC | PRN

## 2017-05-05 MED ORDER — ACETAMINOPHEN 325 MG PO TABS: 650.0000 mg | ORAL_TABLET | ORAL | Status: DC | PRN

## 2017-05-05 MED ORDER — PRENATAL MULTIVITAMIN CH
1.0000 | ORAL_TABLET | Freq: Every day | ORAL | Status: DC
  Administered 2017-05-06: 1 via ORAL
  Filled 2017-05-05: qty 1

## 2017-05-05 MED ORDER — ONDANSETRON HCL 4 MG/2ML IJ SOLN: 4.0000 mg | INTRAMUSCULAR | Status: DC | PRN

## 2017-05-05 MED ORDER — EPHEDRINE 5 MG/ML INJ
10.0000 mg | INTRAVENOUS | Status: DC | PRN
  Filled 2017-05-05: qty 2

## 2017-05-05 MED ORDER — SOD CITRATE-CITRIC ACID 500-334 MG/5ML PO SOLN: 30.0000 mL | ORAL | Status: DC | PRN

## 2017-05-05 MED ORDER — LACTATED RINGERS IV SOLN
500.0000 mL | Freq: Once | INTRAVENOUS | Status: AC
  Administered 2017-05-05: 500 mL via INTRAVENOUS

## 2017-05-05 MED ORDER — COCONUT OIL OIL: 1.0000 "application " | TOPICAL_OIL | Status: DC | PRN

## 2017-05-05 MED ORDER — OXYCODONE-ACETAMINOPHEN 5-325 MG PO TABS: 2.0000 | ORAL_TABLET | ORAL | Status: DC | PRN

## 2017-05-05 MED ORDER — OXYTOCIN BOLUS FROM INFUSION
500.0000 mL | Freq: Once | INTRAVENOUS | Status: AC
  Administered 2017-05-05: 500 mL via INTRAVENOUS

## 2017-05-05 MED ORDER — SIMETHICONE 80 MG PO CHEW: 80.0000 mg | CHEWABLE_TABLET | ORAL | Status: DC | PRN

## 2017-05-05 MED ORDER — TERBUTALINE SULFATE 1 MG/ML IJ SOLN
0.2500 mg | Freq: Once | INTRAMUSCULAR | Status: DC | PRN
  Filled 2017-05-05: qty 1

## 2017-05-05 MED ORDER — LIDOCAINE HCL (PF) 1 % IJ SOLN
INTRAMUSCULAR | Status: DC | PRN
  Administered 2017-05-05 (×2): 7 mL via EPIDURAL

## 2017-05-05 MED ORDER — ONDANSETRON HCL 4 MG PO TABS: 4.0000 mg | ORAL_TABLET | ORAL | Status: DC | PRN

## 2017-05-05 MED ORDER — FENTANYL 2.5 MCG/ML BUPIVACAINE 1/10 % EPIDURAL INFUSION (WH - ANES)
14.0000 mL/h | INTRAMUSCULAR | Status: DC | PRN
Start: 2017-05-05 — End: 2017-05-05
  Administered 2017-05-05 (×2): 14 mL/h via EPIDURAL
  Filled 2017-05-05: qty 100

## 2017-05-05 MED ORDER — MEASLES, MUMPS & RUBELLA VAC ~~LOC~~ INJ: 0.5000 mL | INJECTION | Freq: Once | SUBCUTANEOUS | Status: DC

## 2017-05-05 MED ORDER — OXYCODONE-ACETAMINOPHEN 5-325 MG PO TABS: 1.0000 | ORAL_TABLET | ORAL | Status: DC | PRN

## 2017-05-05 MED ORDER — DIPHENHYDRAMINE HCL 50 MG/ML IJ SOLN: 12.5000 mg | INTRAMUSCULAR | Status: DC | PRN

## 2017-05-05 MED ORDER — IBUPROFEN 600 MG PO TABS
600.0000 mg | ORAL_TABLET | Freq: Four times a day (QID) | ORAL | Status: DC
  Administered 2017-05-05 – 2017-05-07 (×7): 600 mg via ORAL
  Filled 2017-05-05 (×7): qty 1

## 2017-05-05 MED ORDER — ZOLPIDEM TARTRATE 5 MG PO TABS: 5.0000 mg | ORAL_TABLET | Freq: Every evening | ORAL | Status: DC | PRN

## 2017-05-05 MED ORDER — ONDANSETRON HCL 4 MG/2ML IJ SOLN
4.0000 mg | Freq: Four times a day (QID) | INTRAMUSCULAR | Status: DC | PRN
Start: 2017-05-05 — End: 2017-05-05

## 2017-05-05 MED ORDER — LACTATED RINGERS IV SOLN
INTRAVENOUS | Status: DC
  Administered 2017-05-05: 08:00:00 via INTRAVENOUS

## 2017-05-05 MED ORDER — LIDOCAINE HCL (PF) 1 % IJ SOLN
30.0000 mL | INTRAMUSCULAR | Status: DC | PRN
  Filled 2017-05-05: qty 30

## 2017-05-05 MED ORDER — PHENYLEPHRINE 40 MCG/ML (10ML) SYRINGE FOR IV PUSH (FOR BLOOD PRESSURE SUPPORT)
80.0000 ug | PREFILLED_SYRINGE | INTRAVENOUS | Status: DC | PRN
  Filled 2017-05-05: qty 5

## 2017-05-05 MED ORDER — TETANUS-DIPHTH-ACELL PERTUSSIS 5-2.5-18.5 LF-MCG/0.5 IM SUSP: 0.5000 mL | Freq: Once | INTRAMUSCULAR | Status: DC

## 2017-05-05 MED ORDER — DIBUCAINE 1 % RE OINT: 1.0000 "application " | TOPICAL_OINTMENT | RECTAL | Status: DC | PRN

## 2017-05-05 MED ORDER — DIPHENHYDRAMINE HCL 25 MG PO CAPS: 25.0000 mg | ORAL_CAPSULE | Freq: Four times a day (QID) | ORAL | Status: DC | PRN

## 2017-05-05 MED ORDER — BENZOCAINE-MENTHOL 20-0.5 % EX AERO: 1.0000 "application " | INHALATION_SPRAY | CUTANEOUS | Status: DC | PRN

## 2017-05-05 MED ORDER — BUTORPHANOL TARTRATE 1 MG/ML IJ SOLN: 1.0000 mg | INTRAMUSCULAR | Status: DC | PRN

## 2017-05-05 MED ORDER — PHENYLEPHRINE 40 MCG/ML (10ML) SYRINGE FOR IV PUSH (FOR BLOOD PRESSURE SUPPORT)
80.0000 ug | PREFILLED_SYRINGE | INTRAVENOUS | Status: DC | PRN
  Filled 2017-05-05: qty 10
  Filled 2017-05-05: qty 5

## 2017-05-05 NOTE — Anesthesia Procedure Notes (Signed)
Epidural Patient location during procedure: OB Start time: 05/05/2017 10:16 AM End time: 05/05/2017 10:20 AM  Staffing Anesthesiologist: Leilani AbleHATCHETT, Kramer Hanrahan Performed: anesthesiologist   Preanesthetic Checklist Completed: patient identified, site marked, surgical consent, pre-op evaluation, timeout performed, IV checked, risks and benefits discussed and monitors and equipment checked  Epidural Patient position: sitting Prep: site prepped and draped and DuraPrep Patient monitoring: continuous pulse ox and blood pressure Approach: midline Location: L3-L4 Injection technique: LOR air  Needle:  Needle type: Tuohy  Needle gauge: 17 G Needle length: 9 cm and 9 Needle insertion depth: 5 cm cm Catheter type: closed end flexible Catheter size: 19 Gauge Catheter at skin depth: 10 cm Test dose: negative and Other  Assessment Sensory level: T9 Events: blood not aspirated, injection not painful, no injection resistance, negative IV test and no paresthesia  Additional Notes Reason for block:procedure for pain
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
## 2017-05-05 NOTE — Anesthesia Pain Management Evaluation Note (Signed)
  CRNA Pain Management Visit Note  Patient: Rhonda Blanchard, 31 y.o., female  "Hello I am a [facility] of the anesthesia team at [facility]. We have an anesthesia team available at all times to provide care throughout the hospital, including epidural management and anesthesia for C-section. I don't know your plan for the delivery whether it a natural birth, water birth, IV sedation, nitrous supplementation, doula or epidural, but we want to meet your pain goals."   1.Was your pain managed to your expectations on prior hospitalizations?   Yes   2.What is your expectation for pain management during this hospitalization?     Epidural  3.How can we help you reach that goal? unsure  Record the patient's initial score and the patient's pain goal.   Pain: 10  Pain Goal: 10 [facility] wants you to be able to say your pain was always managed very well.  [name],Rhonda Blanchard 05/05/2017

## 2017-05-05 NOTE — Anesthesia Preprocedure Evaluation (Signed)
Anesthesia Evaluation  Patient identified by MRN, date of birth, ID band Patient awake    Reviewed: Allergy & Precautions, H&P , NPO status , Patient's Chart, lab work & pertinent test results  Airway Mallampati: I  TM Distance: >3 FB Neck ROM: full    Dental no notable dental hx.    Pulmonary neg pulmonary ROS, former smoker,    Pulmonary exam normal breath sounds clear to auscultation       Cardiovascular negative cardio ROS Normal cardiovascular exam Rhythm:regular Rate:Normal     Neuro/Psych negative neurological ROS  negative psych ROS   GI/Hepatic negative GI ROS, Neg liver ROS,   Endo/Other  negative endocrine ROS  Renal/GU negative Renal ROS  negative genitourinary   Musculoskeletal   Abdominal Normal abdominal exam  (+)   Peds  Hematology negative hematology ROS (+)   Anesthesia Other Findings   Reproductive/Obstetrics (+) Pregnancy                             Anesthesia Physical Anesthesia Plan  ASA: II  Anesthesia Plan: Epidural   Post-op Pain Management:    Induction:   PONV Risk Score and Plan:   Airway Management Planned:   Additional Equipment:   Intra-op Plan:   Post-operative Plan:   Informed Consent: I have reviewed the patients History and Physical, chart, labs and discussed the procedure including the risks, benefits and alternatives for the proposed anesthesia with the patient or authorized representative who has indicated his/her understanding and acceptance.     Plan Discussed with:   Anesthesia Plan Comments:         Anesthesia Quick Evaluation

## 2017-05-05 NOTE — Progress Notes (Signed)
Patient ID: Rhonda Blanchard, female   DOB: [birth year], 31 y.o.   MRN: [id number] Comfortable with epidural Afeb, VSS FHT- 120s, Cat I, ctx q 3-5 min VE-5/50/-2, vtx, AROM clear Continue pitocin, monitor progress, anticipate SVD

## 2017-05-06 LAB — RPR: RPR Ser Ql: NONREACTIVE

## 2017-05-06 IMAGING — US US MFM OB LIMITED
1 series · 13 of 27 positions shown · non-contrast
Comparison: none

[Series 1: us ob mfm follow up · 13 of 27 slices shown]
[im 2/27]
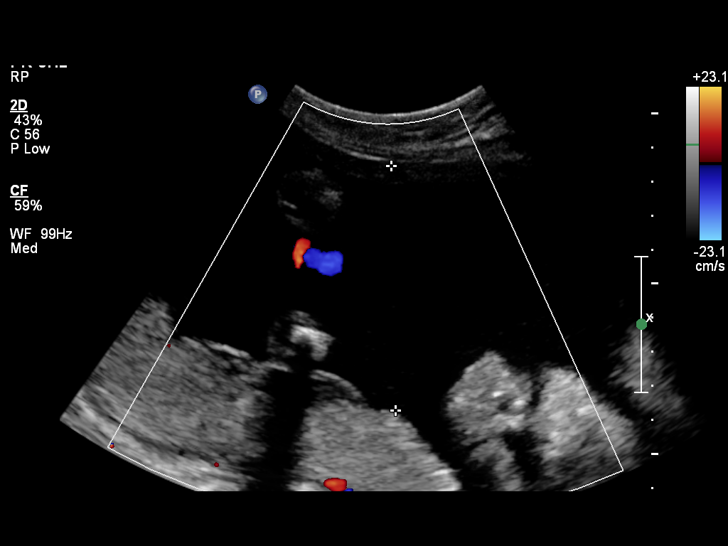
[im 4/27]
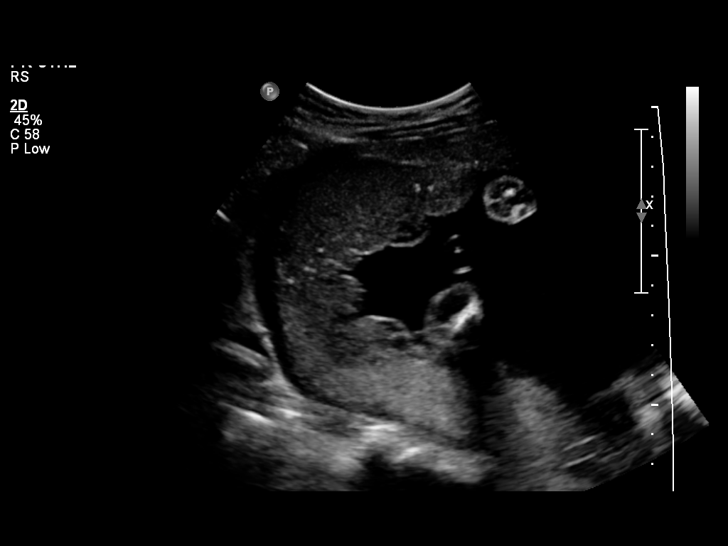
[im 6/27]
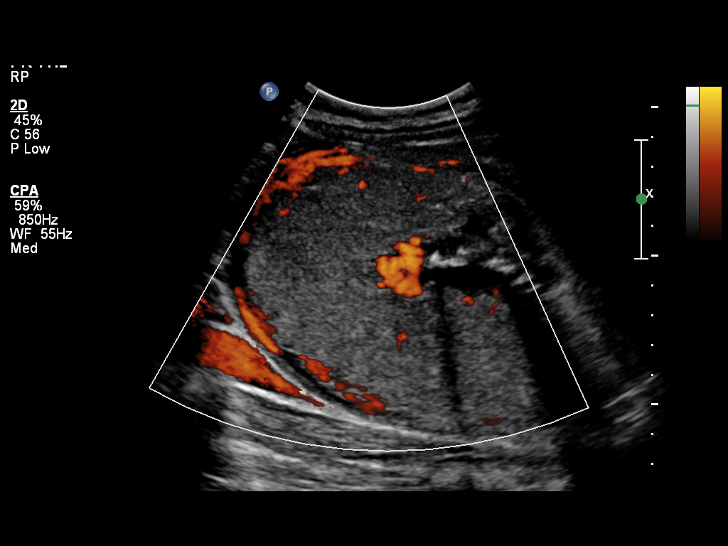
[im 8/27]
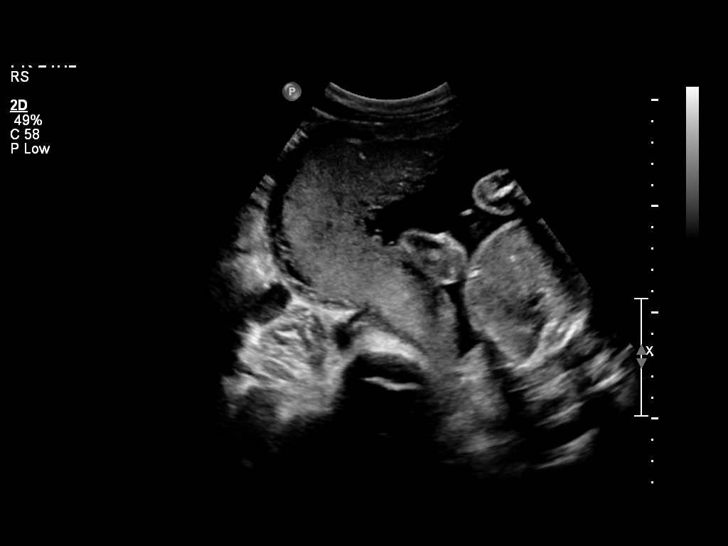
[im 10/27]
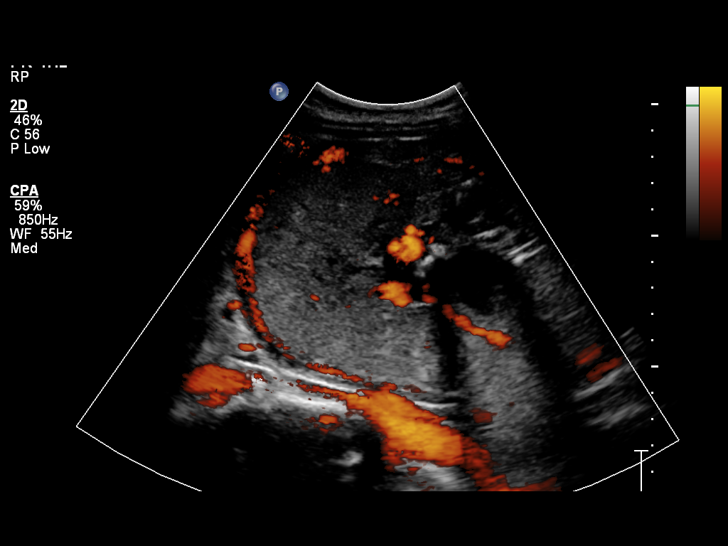
[im 12/27]
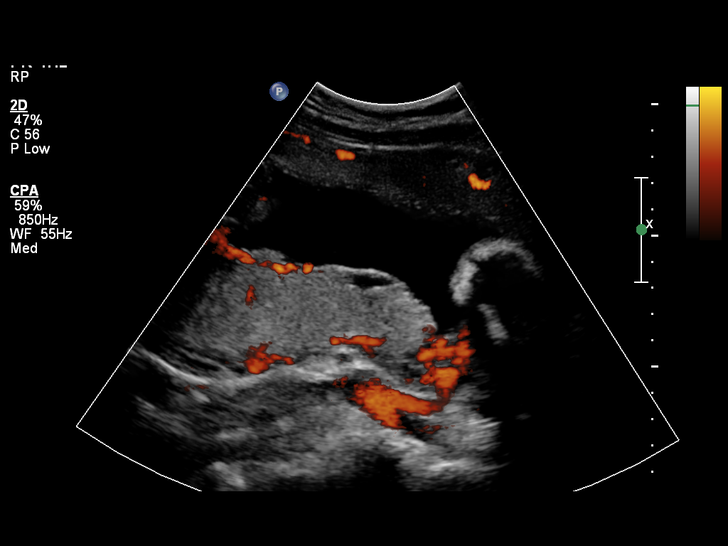
[im 14/27]
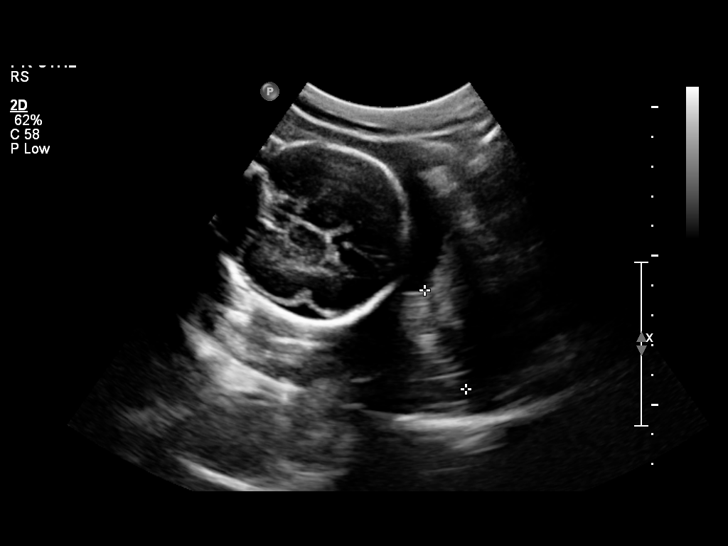
[im 16/27]
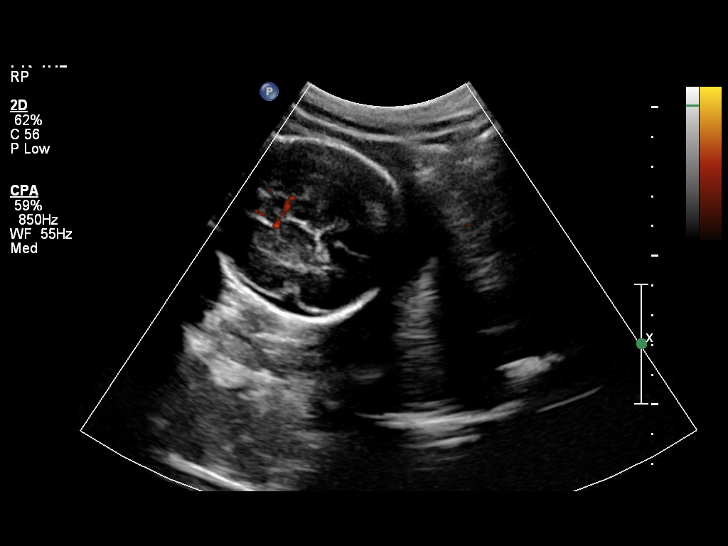
[im 18/27]
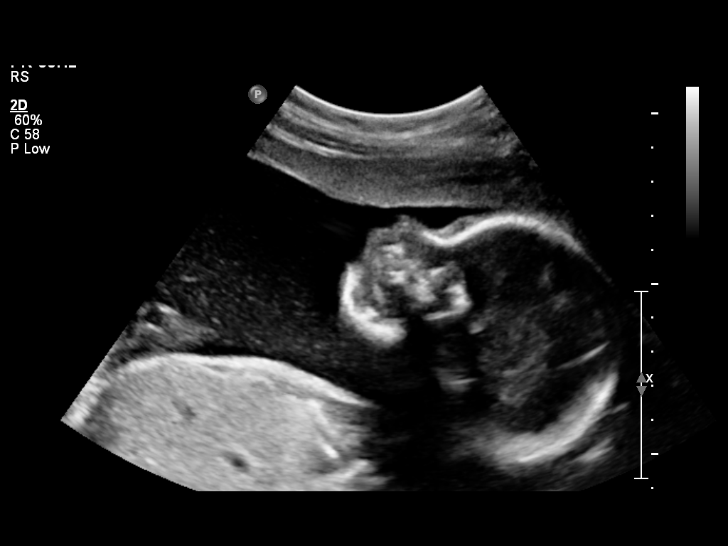
[im 20/27]
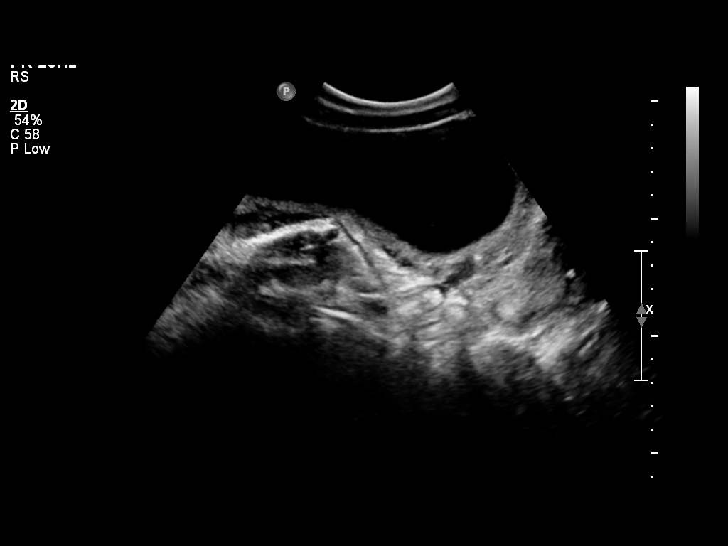
[im 22/27]
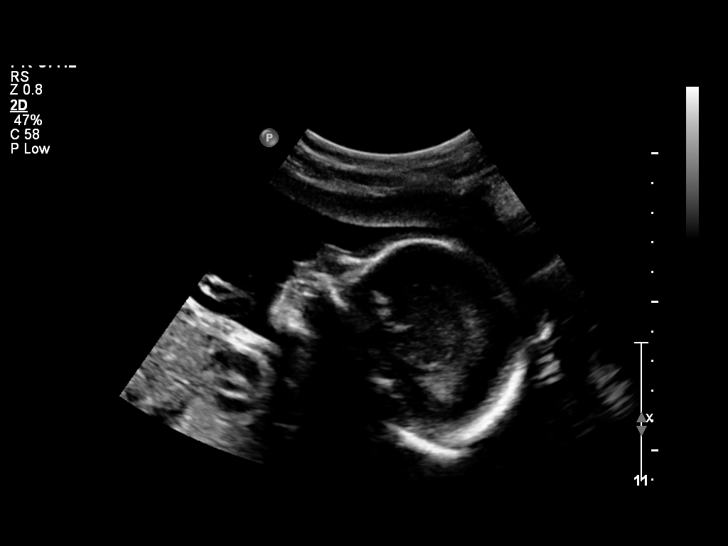
[im 24/27]
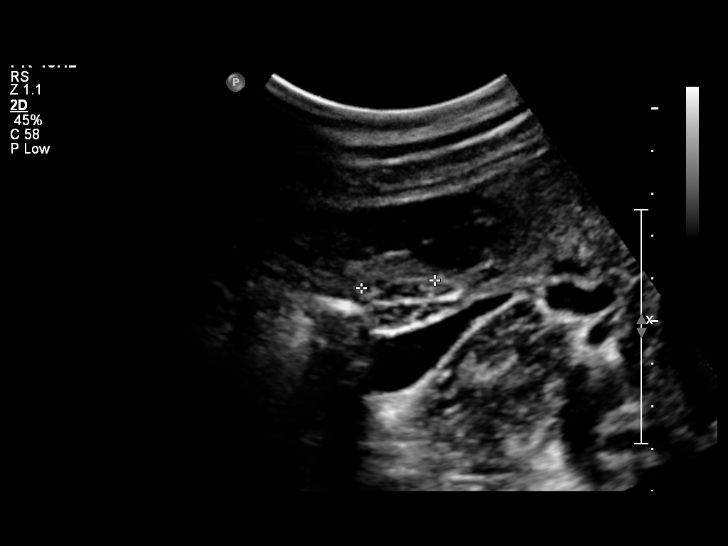
[im 26/27]
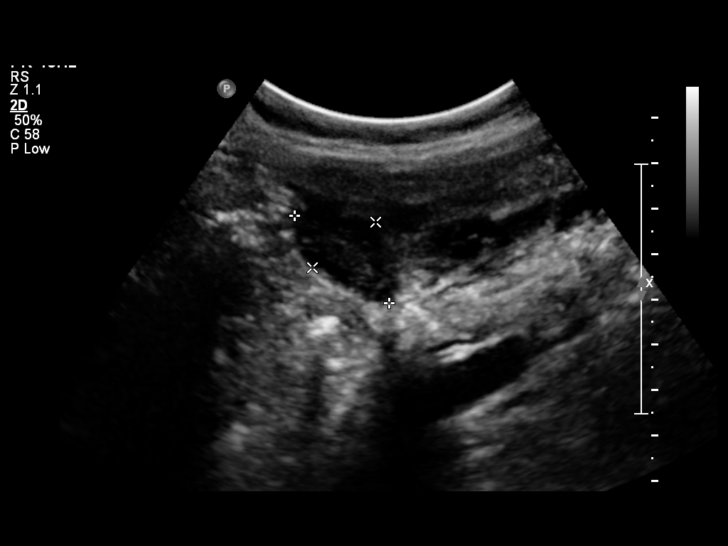

[13 of 27 positions shown; findings below may reference images not displayed]

OBSTETRICS REPORT
(Signed Final 06/05/2015 [DATE])

Name:       MIKAL SCHENK                      Visit  06/05/2015 [DATE]
Date:

By:
Service(s) Provided

US MFM OB LIMITED                                      76815.01
Indications

Vaginal bleeding in pregnancy, second trimester
x 4days
No or Little Prenatal Care
Abdominal pain in pregnancy (LLQ)
23 weeks gestation of pregnancy
Fetal Evaluation

Num Of             1
Fetuses:
Fetal Heart        153                          bpm
Rate:
Cardiac Activity:  Observed
Presentation:      Cephalic
Placenta:          Posterior, above cervical
os
P. Cord            Visualized, central
Insertion:

Comment     No placental abruption or previa identified.
:

Amniotic Fluid
AFI FV:      Subjectively within normal limits
Larg Pckt:      7.2  cm
Gestational Age

LMP:           24w 6d        Date:  12/13/14                  EDD:   09/19/15
Best:          23w 3d    Det. By:   Early Ultrasound          EDD:   09/29/15
(02/12/15)
Cervix Uterus Adnexa

Cervical Length:    3.6       cm

Cervix:       Normal appearance by transabdominal scan.
Left Ovary:    Within normal limits.
Right Ovary:   Within normal limits.

Adnexa:     No abnormality visualized.
Impression

SIUP at 23+3 weeks
Normal amniotic fluid volume
Posterior placenta; no previa; no subchorionic fluid
collections/hemorrhage identified
Recommendations

Follow-up ultrasounds as clinically indicated.

## 2017-05-06 NOTE — Progress Notes (Signed)
Post Partum Day 1 Subjective: no complaints, up ad lib, voiding, tolerating PO and nl lochia, pain controlled - but still uncomfortable  Objective: Blood pressure (!) 120/54, pulse 68, temperature 98 F (36.7 C), temperature source Oral, resp. rate 18, height 5' 4.5" (1.638 m), weight 79.4 kg (175 lb), last menstrual period 08/07/2016, not currently breastfeeding.  Physical Exam:  General: alert and no distress Lochia: appropriate Uterine Fundus: firm Incision: healing well DVT Evaluation: No evidence of DVT seen on physical exam.   Recent Labs  05/05/17 0815  HGB 9.5*  HCT 30.4*    Assessment/Plan: Plan for discharge tomorrow, Breastfeeding and Lactation consult   LOS: 1 day   Bain Rhonda Blanchard 05/06/2017, 7:02 AM

## 2017-05-06 NOTE — Plan of Care (Signed)
Problem: Pain Management: Goal: General experience of comfort will improve and pain level will decrease Outcome: Completed/Met Date Met: 05/06/17 Patient consistently complaining of pain of 9-10 on a scale of 10. Provided pain medication, discussed importance of continuing ibuprofen scheduled and discussed the importance of emptying bladder at least every 2 hours.

## 2017-05-06 NOTE — Anesthesia Postprocedure Evaluation (Signed)
Anesthesia Post Note  Patient: Rhonda Blanchard  Procedure(s) Performed: * No procedures listed *     Patient location during evaluation: Mother Baby Anesthesia Type: Epidural Level of consciousness: awake Pain management: pain level controlled Vital Signs Assessment: post-procedure vital signs reviewed and stable Respiratory status: spontaneous breathing Cardiovascular status: stable Postop Assessment: no headache, epidural receding and patient able to bend at knees Anesthetic complications: no Comments: Mild backache    Last Vitals:  Vitals:   05/05/17 2100 05/06/17 0100  BP: 119/62 (!) 120/54  Pulse: 87 68  Resp: 16 18  Temp: 36.6 C 36.7 C    Last Pain:  Vitals:   05/06/17 0500  TempSrc:   PainSc: 10-Worst pain ever   Pain Goal:                 [name],[name]

## 2017-05-07 MED ORDER — OXYCODONE HCL 5 MG PO TABS: 5.0000 mg | ORAL_TABLET | ORAL | 0 refills | Status: AC | PRN

## 2017-05-07 MED ORDER — IBUPROFEN 600 MG PO TABS: 600.0000 mg | ORAL_TABLET | Freq: Four times a day (QID) | ORAL | 0 refills | Status: AC

## 2017-05-07 NOTE — Progress Notes (Signed)
Patient ID: Rhonda Blanchard, female   DOB: 09-Mar-1986, 31 y.o.   MRN: [id number] PPD #2 Doing ok, pain better Afeb, VSS D/c home

## 2017-05-07 NOTE — Discharge Summary (Signed)
OB Discharge Summary     Patient Name: Rhonda Blanchard DOB: 1986-01-30 MRN: [id number]  Date of admission: 05/05/2017 Delivering MD: Jackelyn KnifeMEISINGER, Luisdavid Hamblin   Date of discharge: 05/07/2017  Admitting diagnosis: INDUCTION Intrauterine pregnancy: 6667w2d     Secondary diagnosis:  Active Problems:   Indication for care in labor or delivery   SVD (spontaneous vaginal delivery)      Discharge diagnosis: Term Pregnancy Delivered                                  Hospital course:  Induction of Labor With Vaginal Delivery   31 y.o. yo 281 121 2744G4P2012 at 2367w2d was admitted to the hospital 05/05/2017 for induction of labor.  Indication for induction: Favorable cervix at term.  Patient had an uncomplicated labor course as follows: Membrane Rupture Time/Date: 11:27 AM ,05/05/2017   Intrapartum Procedures: Episiotomy: None [1]                                         Lacerations:  None [1]  Patient had delivery of a Viable infant.  Information for the patient's newborn:  Providence CrosbyOliver, Boy Eilene [[id number]][[id number]]  Delivery Method: Vaginal, Vacuum (Extractor) (Filed from Delivery Summary)   05/05/2017  Details of delivery can be found in separate delivery note.  Patient had a routine postpartum course. Patient is discharged home 05/07/17.  Physical exam  Vitals:   05/06/17 0100 05/06/17 0925 05/06/17 1825 05/07/17 0600  BP: (!) 120/54 105/63 138/78 110/63  Pulse: 68 79 73 70  Resp: 18 18 17 18   Temp: 98 F (36.7 C) 98.7 F (37.1 C) 98.3 F (36.8 C) 97.6 F (36.4 C)  TempSrc: Oral Oral Oral Oral  SpO2:   100%   Weight:      Height:       General: alert Lochia: appropriate Uterine Fundus: firm  Labs: Lab Results  Component Value Date   WBC 8.8 05/05/2017   HGB 9.5 (L) 05/05/2017   HCT 30.4 (L) 05/05/2017   MCV 79.8 05/05/2017   PLT 201 05/05/2017   CMP Latest Ref Rng & Units 08/08/2014  Glucose 70 - 99 mg/dL 93  BUN 6 - 23 mg/dL 9  Creatinine 2.530.50 - 6.641.10 mg/dL 4.030.72  Sodium 474137 - 259147 mEq/L 145   Potassium 3.7 - 5.3 mEq/L 3.4(L)  Chloride 96 - 112 mEq/L 106  CO2 19 - 32 mEq/L 27  Calcium 8.4 - 10.5 mg/dL 9.2  Total Protein 6.0 - 8.3 g/dL 7.2  Total Bilirubin 0.3 - 1.2 mg/dL 0.3  Alkaline Phos 39 - 117 U/L 33(L)  AST 0 - 37 U/L 14  ALT 0 - 35 U/L 17    Discharge instruction: per After Visit Summary and "Baby and Me Booklet".  After visit meds:  Allergies as of 05/07/2017   No Known Allergies     Medication List    TAKE these medications   calcium carbonate 500 MG chewable tablet Commonly known as:  TUMS - dosed in mg elemental calcium Chew 1-2 tablets by mouth daily.   cyclobenzaprine 10 MG tablet Commonly known as:  FLEXERIL Take 10 mg by mouth 2 (two) times daily as needed for muscle spasms.   ibuprofen 600 MG tablet Commonly known as:  ADVIL,MOTRIN Take 1 tablet (600 mg total) by mouth every 6 (six) hours.  oxyCODONE 5 MG immediate release tablet Commonly known as:  Oxy IR/ROXICODONE Take 1 tablet (5 mg total) by mouth every 4 (four) hours as needed for severe pain.       Diet: routine diet  Activity: Advance as tolerated. Pelvic rest for 6 weeks.   Outpatient follow up:3 weeks  Newborn Data: Live born female  Birth Weight: 8 lb 5 oz (3771 g) APGAR: 9, 9  Baby Feeding: Breast Disposition:home with mother   05/07/2017 Zenaida Nieceodd D Ulysse Siemen, MD

## 2017-05-07 NOTE — Discharge Instructions (Signed)
As per discharge pamphlet °

## 2017-06-04 IMAGING — US US MFM OB COMP +14 WKS
1 series · 13 of 28 positions shown · non-contrast
Comparison: none

[Series 1: us mfm ob comp +14 wks · 13 of 93 slices shown]
[im 4/93]
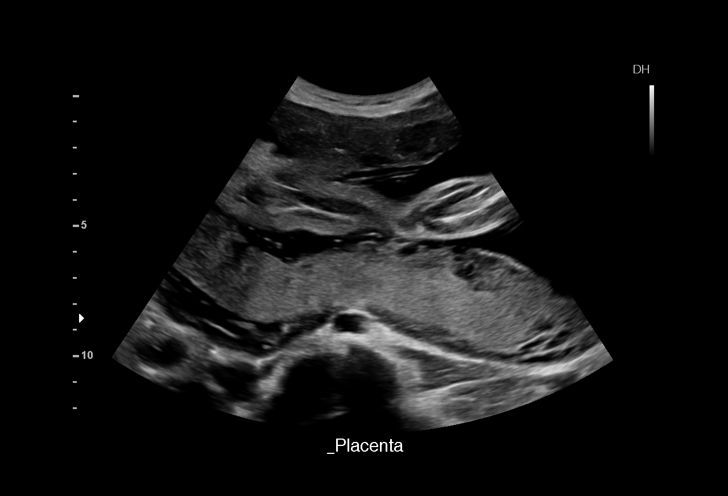
[im 11/93]
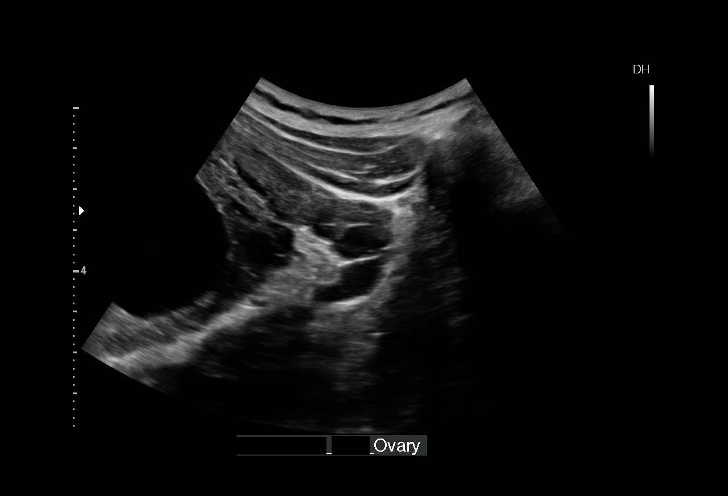
[im 18/93]
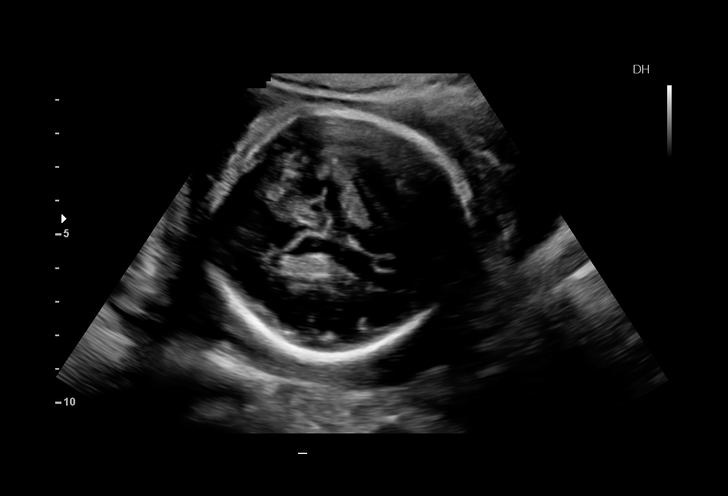
[im 24/93]
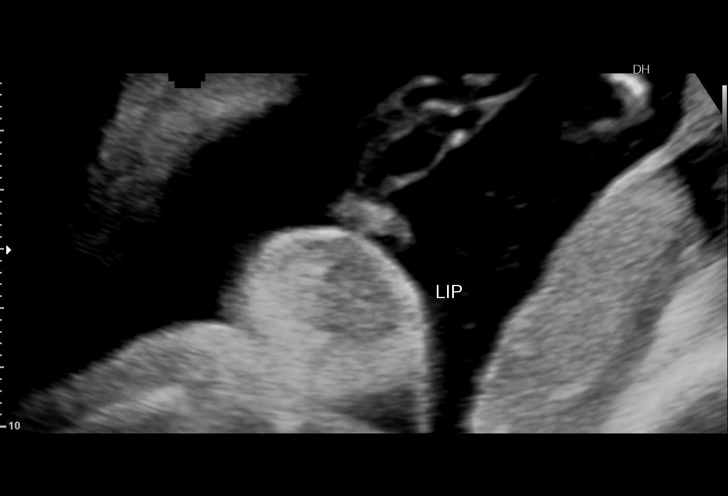
[im 31/93]
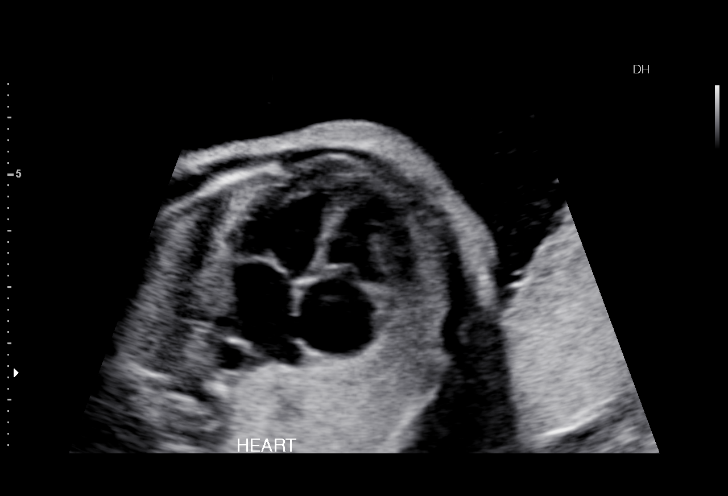
[im 38/93]
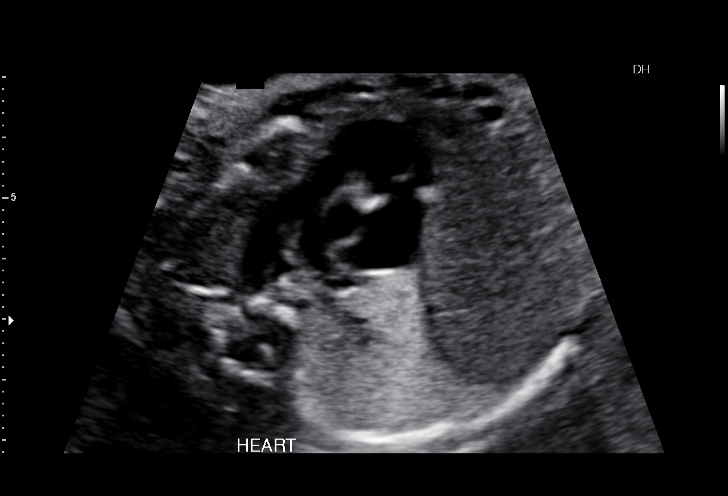
[im 48/93]
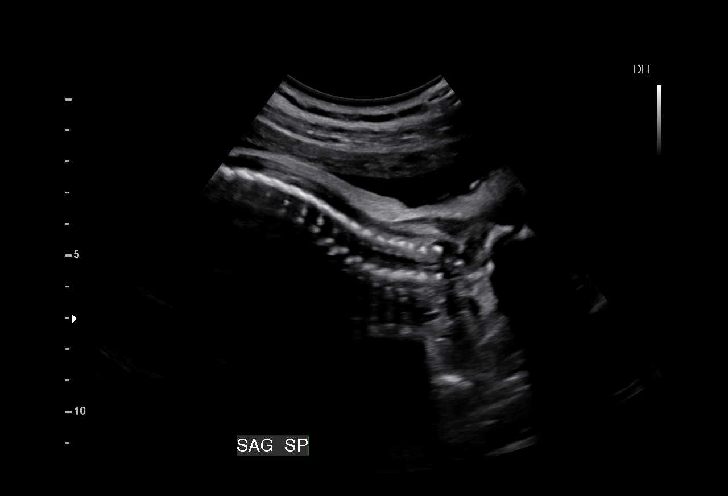
[im 55/93]
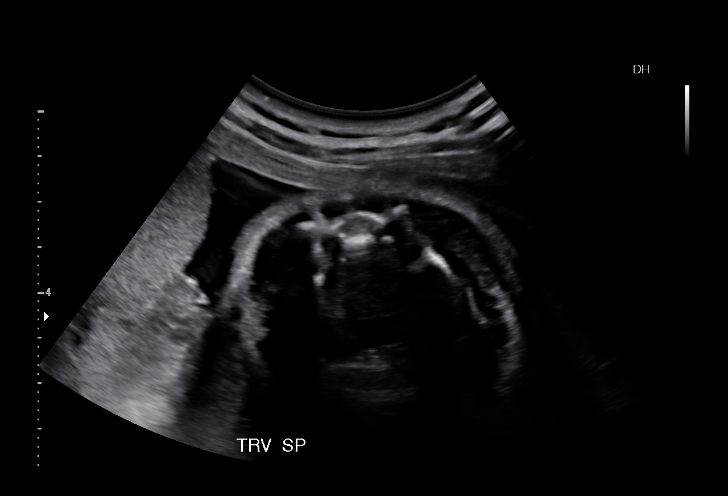
[im 62/93]
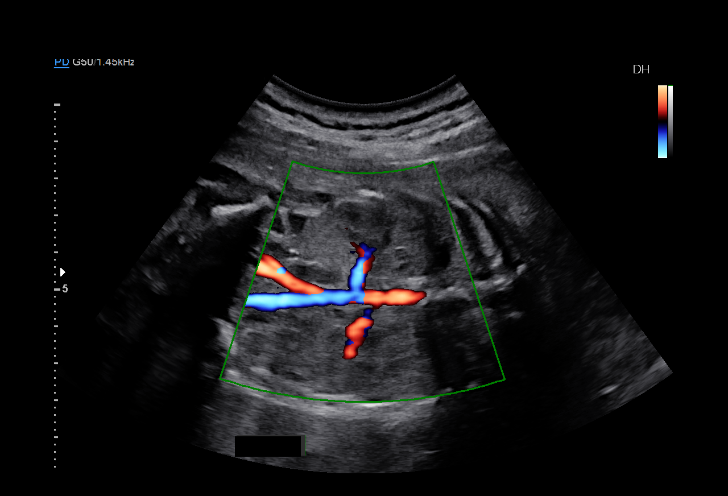
[im 69/93]
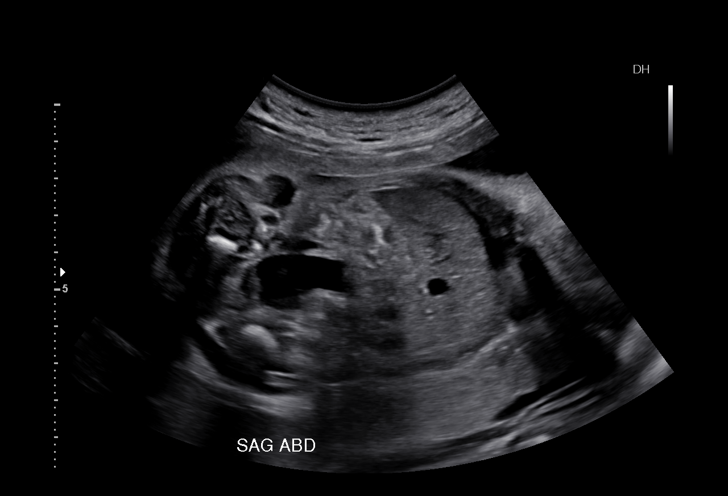
[im 75/93]
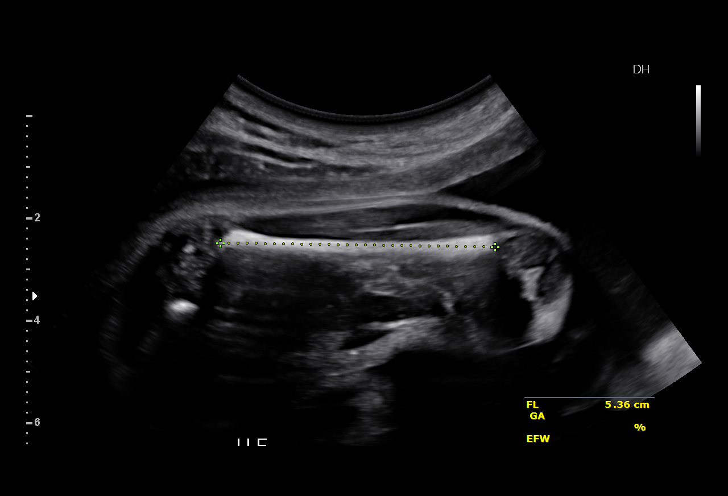
[im 82/93]
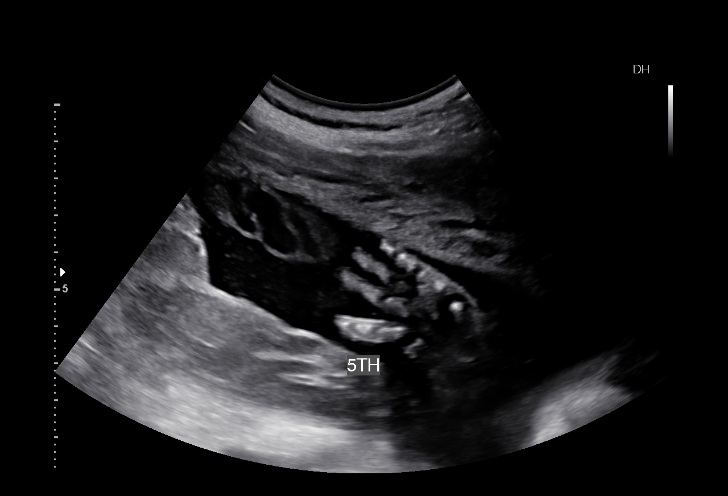
[im 89/93]
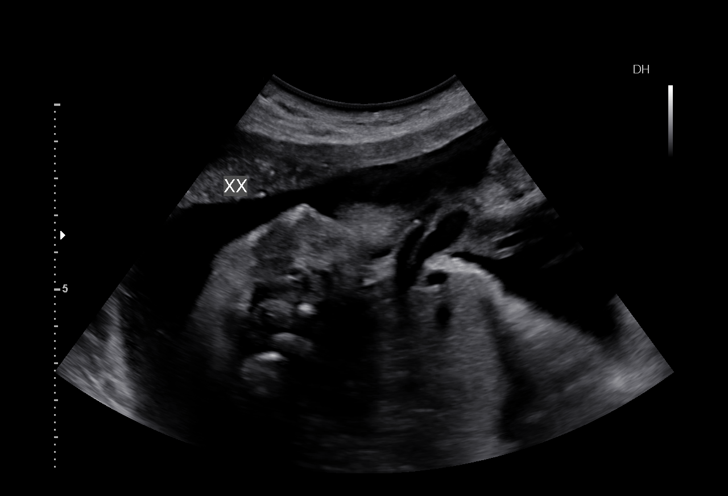

[13 of 28 positions shown; findings below may reference images not displayed]

OBSTETRICS REPORT
(Signed Final 07/04/2015 [DATE])

Service(s) Provided

Indications

27 weeks gestation of pregnancy
Basic anatomic survey                                 Z36
No or Little Prenatal Care
Drug use complicating pregnancy, second
trimester (+THC)
Fetal Evaluation

Num Of Fetuses:    1
Fetal Heart Rate:  165                          bpm
Cardiac Activity:  Observed
Presentation:      Cephalic
Placenta:          Posterior Fundal, above
cervical os
P. Cord            Visualized, central
Insertion:

Amniotic Fluid
AFI FV:      Subjectively within normal limits
Larg Pckt:     5.6  cm
Biometry

BPD:       72  mm     G. Age:  28w 6d                CI:        71.97   70 - 86
FL/HC:      19.5   18.8 -
20.6
HC:     270.1  mm     G. Age:  29w 3d       79  %    HC/AC:      1.15   1.05 -
1.21
AC:     234.2  mm     G. Age:  27w 5d       48  %    FL/BPD:     73.1   71 - 87
FL:      52.6  mm     G. Age:  28w 0d       48  %    FL/AC:      22.5   20 - 24
HUM:     47.6  mm     G. Age:  28w 0d       55  %
CER:     33.4  mm     G. Age:  29w 1d       73  %

Est. FW:    4442  gm      2 lb 9 oz     62  %
Gestational Age

LMP:           29w 0d        Date:  12/13/14                 EDD:   09/19/15
U/S Today:     28w 4d                                        EDD:   09/22/15
Best:          27w 4d     Det. By:  Early Ultrasound         EDD:   09/29/15
(02/12/15)
Anatomy

Cranium:          Appears normal         Aortic Arch:      Appears normal
Fetal Cavum:      Appears normal         Ductal Arch:      Appears normal
Ventricles:       Appears normal         Diaphragm:        Appears normal
Choroid Plexus:   Appears normal         Stomach:          Appears normal, left
sided
Cerebellum:       Appears normal         Abdomen:          Echogenic Bowel
Posterior Fossa:  Appears normal         Abdominal Wall:   Appears nml (cord
insert, abd wall)
Nuchal Fold:      Not applicable (>20    Cord Vessels:     Appears normal (3
wks GA)                                  vessel cord)
Face:             Appears normal         Kidneys:          Appear normal
(orbits and profile)
Lips:             Appears normal         Bladder:          Appears normal
Heart:            Appears normal         Spine:            Appears normal
(4CH, axis, and
situs)
RVOT:             Appears normal         Lower             Appears normal
Extremities:
LVOT:             Appears normal         Upper             Appears normal
Extremities:

Other:  Fetus appears to be a female. Heels and 5th digit visualized. Nasal
bone visualized.
Targeted Anatomy

Fetal Central Nervous System
Cisterna Magna:
Cervix Uterus Adnexa

Cervical Length:    3        cm

Cervix:       Normal appearance by transabdominal scan.
Left Ovary:    Within normal limits.
Right Ovary:   Within normal limits.
Adnexa:     No abnormality visualized.
Impression

SIUP at 32w7d
EFW 62nd%
Echogenic bowel
no previa
amniotic fluid is gestational age appropriate

Hyperechogenic bowel was noted and discussed at bedside.
A targeted survey of the fetal anatomy was therefore
performed and no other dysmorphic features, or morphologic
"soft markers" associated with aneuploidy, were identified.
Hyperechogenic bowel is observed in about 0.5% of second
trimester fetuses and is usually of no pathological
significance. The most common cause is intra-amniotic
bleeding, and accordingly there may be an increased risk of
later fetal growth restriction and/or unexplained fetal demise.
However, echogenic bowel is also known to be a marker, in
some cases, for (a) cystic fibrosis, (b) chromosomal
abnormalities or (c) infection (eg, CMV and Toxoplasmosis).
For isolated hyperechogenic bowel, the risk for trisomy 21 is
thought to be 7 - 10 times the background risk. Cystic fibrosis
risk, in this setting, is not as well quantified, but is thought to
be significantly elevated as well.
Recommendations

1. see genetic counseling
2. Panorama drawn today
3. CMV, Toxoplasmosis and Parvovirus drawn today.
4. interval growth in 4 weeks.

questions or concerns.

## 2017-07-15 IMAGING — US US MFM OB FOLLOW-UP
1 series · 13 of 28 positions shown · non-contrast
Comparison: none

[Series 1: us mfm ob follow-up · 13 of 32 slices shown]
[im 2/32]
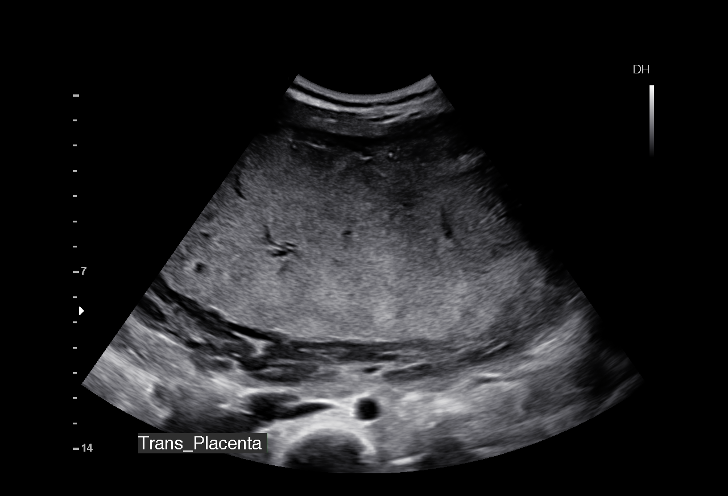
[im 4/32]
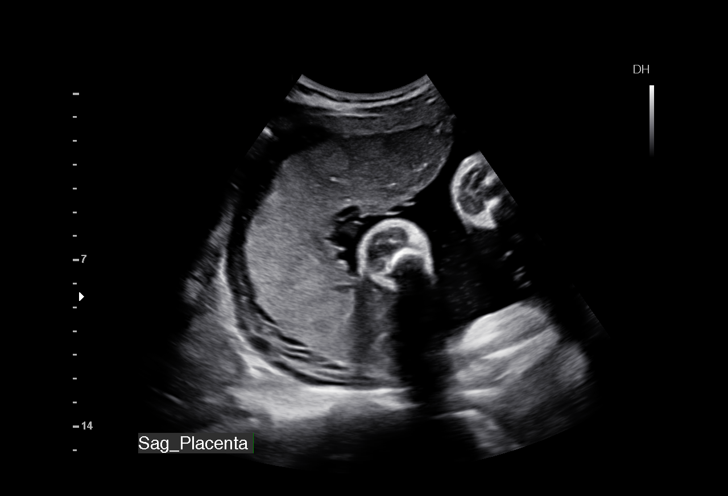
[im 6/32]
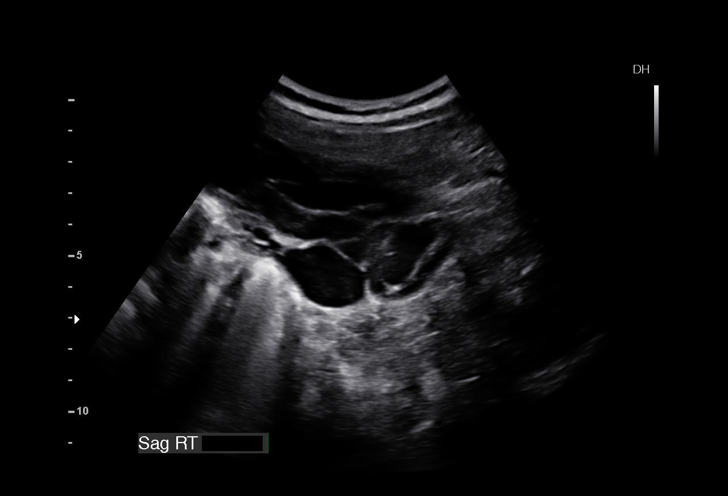
[im 9/32]
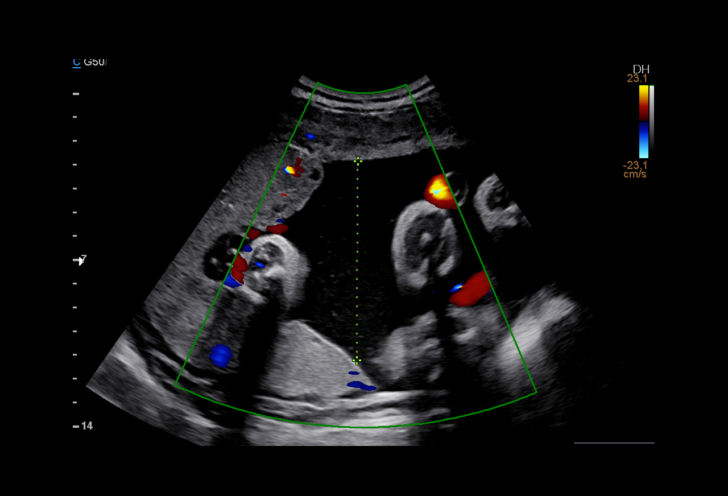
[im 11/32]
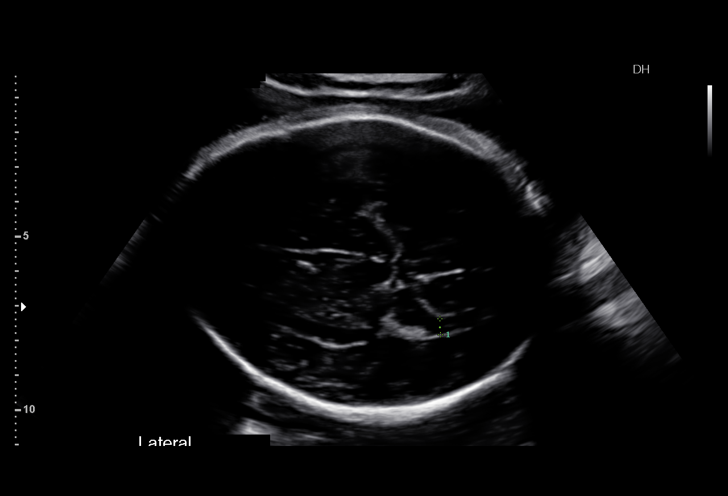
[im 13/32]
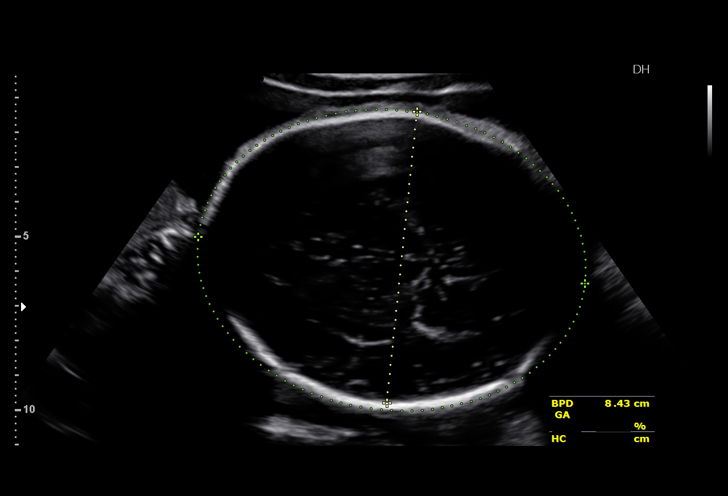
[im 17/32]
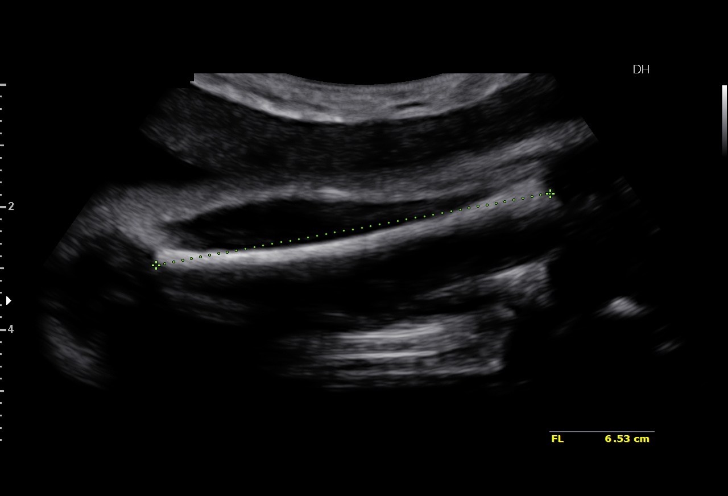
[im 19/32]
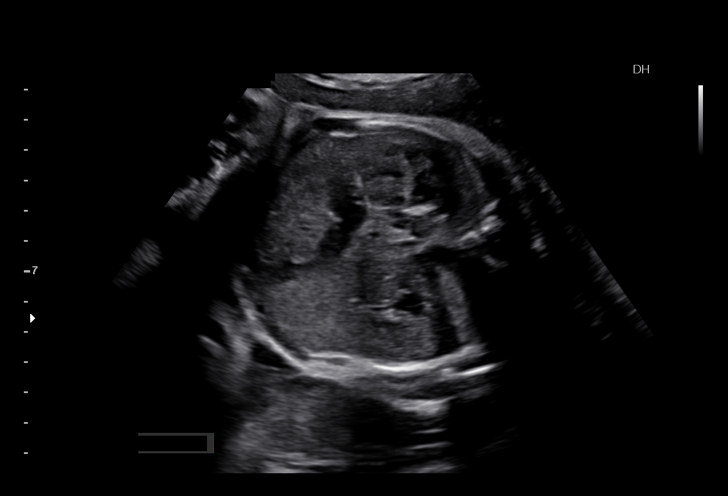
[im 21/32]
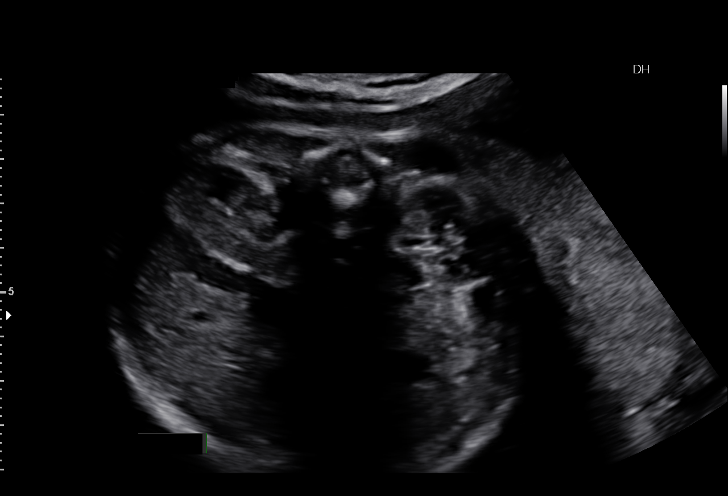
[im 23/32]
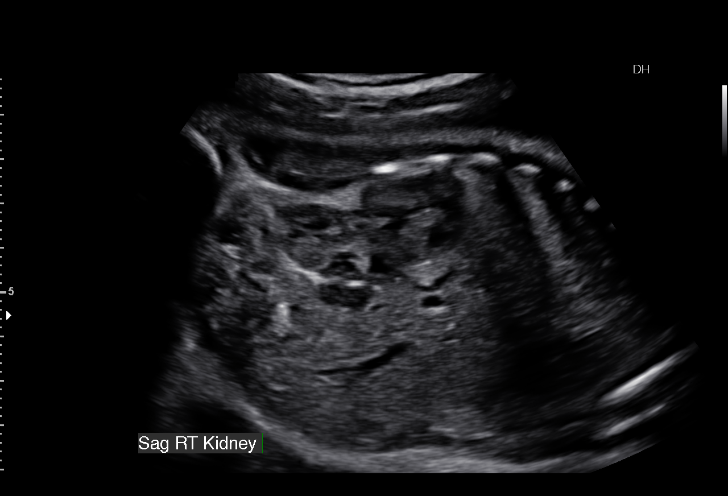
[im 26/32]
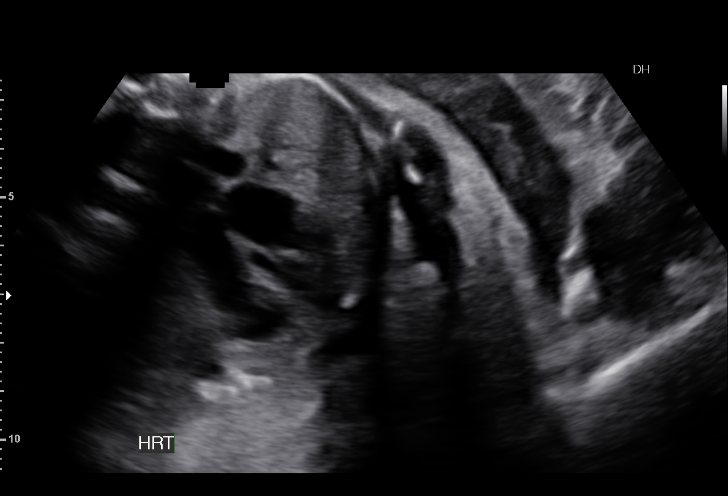
[im 28/32]
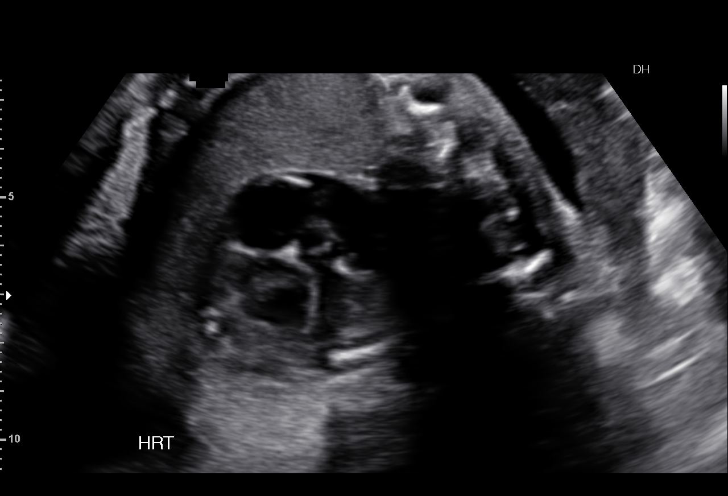
[im 30/32]
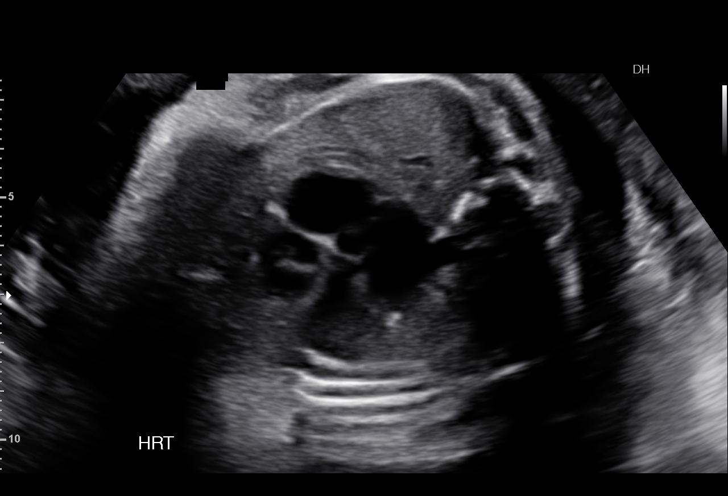

[13 of 28 positions shown; findings below may reference images not displayed]

OBSTETRICS REPORT
(Signed Final 08/14/2015 [DATE])

Name:       FHUEMAUS LABOCHA                      Visit  08/14/2015 [DATE]
Date:

Service(s) Provided

Indications

33 weeks gestation of pregnancy
Echogenic bowel - low risk NIPS, negative CF
screen & viral serologies
No or Little Prenatal Care
Drug use complicating pregnancy, third trimester
(+THC)
Fetal Evaluation

Num Of             1
Fetuses:
Fetal Heart        145                          bpm
Rate:
Cardiac Activity:  Observed
Presentation:      Cephalic
Placenta:          Posterior Fundal, above
cervical os
P. Cord            Previously Visualized
Insertion:

Amniotic Fluid
AFI FV:      Subjectively within normal limits
AFI Sum:     22.57    cm      86  %Tile     Larg Pckt:    8.37   cm
RUQ:   5.83    cm    RLQ:   4.64    cm   LUQ:    3.73    cm   LLQ:    8.37   cm
Biometry

BPD:     84.4   m    G. Age:   34w 0d                 CI:        74.45   70 - 86
m
FL/HC:      21.1   19.9 -
21.5
HC:     310.5   m    G. Age:   34w 5d        46  %    HC/AC:      1.02   0.96 -
m
AC:     303.9   m    G. Age:   34w 3d        77  %    FL/BPD      77.5   71 - 87
m                                     :
FL:      65.4   m    G. Age:   33w 5d        47  %    FL/AC:      21.5   20 - 24
m
HUM:     58.3   m    G. Age:   33w 5d        66  %
m

Est.        8199   gm    5 lb 3 oz      74   %
FW:
Gestational Age

LMP:           34w 6d        Date:  12/13/14                  EDD:   09/19/15
U/S Today:     34w 1d                                         EDD:   09/24/15
Best:          33w 3d    Det. By:   Early Ultrasound          EDD:   09/29/15
(02/12/15)
Anatomy

Cranium:          Appears normal         Aortic Arch:       Previously seen
Fetal Cavum:      Appears normal         Ductal Arch:       Previously seen
Ventricles:       Appears normal         Diaphragm:         Previously seen
Choroid Plexus:   Previously seen        Stomach:           Appears normal,
left sided
Cerebellum:       Previously seen        Abdomen:           Appears normal
Posterior         Previously seen        Abdominal          Previously seen
Fossa:                                   Wall:
Nuchal Fold:      Not applicable (>20    Cord Vessels:      Previously seen
wks GA)
Face:             Orbits and profile     Kidneys:           Appear normal
previously seen
Lips:             Previously seen        Bladder:           Appears normal
Heart:            Appears normal         Spine:             Previously seen
(4CH, axis, and
situs)
RVOT:             Previously seen        Lower              Previously seen
Extremities:
LVOT:             Previously seen        Upper              Previously seen
Extremities:

Other:   Female gender previously seen. Heels and 5th digit previously
visualized. Nasal bone previously visualized. Technically difficult
due to advanced GA and fetal position.
Cervix Uterus Adnexa

Cervix:       Not visualized (advanced GA >54wks)

Adnexa:     No abnormality visualized.
Comments

Ms. Weis returns for follow up due to echogenic bowel.
Work up thus far showed- cell free fetal DNA low risk for
aneuploidy and viral serologies negative for Parvo, Toxo and
CMV.  of note both Parvo and toxo had negative IgG and
IgM.  The patient elected to have these tests redrawn today
in the event of a more recent infection.
Impression

Single IUP at 33w 3d
Normal interval anatomy
The bowel appears somewhat prominent, but no longer
meets criteria for echogenic bowel
Fetal growth is appropriate (74th %tile)
Normal amniotic fluid volume
Recommendations

Follow-up ultrasounds as clinically indicated.

## 2017-08-17 ENCOUNTER — Encounter (HOSPITAL_COMMUNITY): Payer: Self-pay

## 2017-11-16 ENCOUNTER — Inpatient Hospital Stay (HOSPITAL_COMMUNITY)
Admission: AD | Admit: 2017-11-16 | Discharge: 2017-11-16 | Disposition: A | Discharge status: Home / Self Care | Payer: Medicaid Other | Source: Ambulatory Visit | Attending: Obstetrics and Gynecology | Admitting: Obstetrics and Gynecology

## 2017-11-16 ENCOUNTER — Encounter (HOSPITAL_COMMUNITY): Payer: Self-pay

## 2017-11-16 DIAGNOSIS — Z3202 Encounter for pregnancy test, result negative: Secondary | ICD-10-CM | POA: Insufficient documentation

## 2017-11-16 DIAGNOSIS — N760 Acute vaginitis: Secondary | ICD-10-CM | POA: Insufficient documentation

## 2017-11-16 DIAGNOSIS — B9689 Other specified bacterial agents as the cause of diseases classified elsewhere: Secondary | ICD-10-CM | POA: Diagnosis not present

## 2017-11-16 DIAGNOSIS — Z87891 Personal history of nicotine dependence: Secondary | ICD-10-CM | POA: Diagnosis not present

## 2017-11-16 DIAGNOSIS — R109 Unspecified abdominal pain: Secondary | ICD-10-CM | POA: Diagnosis not present

## 2017-11-16 HISTORY — DX: Personal history of other infectious and parasitic diseases: Z86.19

## 2017-11-16 LAB — URINALYSIS, ROUTINE W REFLEX MICROSCOPIC
BACTERIA UA: NONE SEEN
BILIRUBIN URINE: NEGATIVE
Glucose, UA: NEGATIVE mg/dL
KETONES UR: NEGATIVE mg/dL
LEUKOCYTES UA: NEGATIVE
NITRITE: NEGATIVE
Protein, ur: NEGATIVE mg/dL
Specific Gravity, Urine: 1.019 (ref 1.005–1.030)
pH: 6 (ref 5.0–8.0)

## 2017-11-16 LAB — CBC
HEMATOCRIT: 35.3 % — AB (ref 36.0–46.0)
HEMOGLOBIN: 12.2 g/dL (ref 12.0–15.0)
MCH: 30.7 pg (ref 26.0–34.0)
MCHC: 34.6 g/dL (ref 30.0–36.0)
MCV: 88.7 fL (ref 78.0–100.0)
Platelets: 190 10*3/uL (ref 150–400)
RBC: 3.98 MIL/uL (ref 3.87–5.11)
RDW: 15.3 % (ref 11.5–15.5)
WBC: 6.3 10*3/uL (ref 4.0–10.5)

## 2017-11-16 LAB — WET PREP, GENITAL
Sperm: NONE SEEN
Trich, Wet Prep: NONE SEEN
Yeast Wet Prep HPF POC: NONE SEEN

## 2017-11-16 LAB — POCT PREGNANCY, URINE: PREG TEST UR: NEGATIVE

## 2017-11-16 MED ORDER — METRONIDAZOLE 0.75 % VA GEL: 1.0000 | Freq: Two times a day (BID) | VAGINAL | 0 refills | Status: AC

## 2017-11-16 MED ORDER — KETOROLAC TROMETHAMINE 60 MG/2ML IM SOLN
60.0000 mg | Freq: Once | INTRAMUSCULAR | Status: AC
  Administered 2017-11-16: 60 mg via INTRAMUSCULAR
  Filled 2017-11-16: qty 2

## 2017-11-16 NOTE — Discharge Instructions (Signed)
In late 2019, the [facility] will be moving to the [place] campus. At that time, the MAU (Maternity Admissions Unit), where you are being seen today, will no longer take care of non-pregnant patients. We strongly encourage you to find a doctor's office before that time, so that you can be seen with any GYN concerns, like vaginal discharge, urinary tract infection, etc.. in a timely manner. ° °In order to make an office visit more convenient, the [facility] will be offering evening hours with same-day appointments, walk-in appointments and scheduled appointments available during this time. ° °[facility] Hours: °Monday - 8am - 7:30 pm with walk-in between 4pm- 7:30 pm °Tuesday - 8 am - 5 pm (starting 01/12/18 we will be open late and accepting walk-ins from 4pm - 7:30pm) °Wednesday - 8 am - 5 pm (starting 04/14/18 we will be open late and accepting walk-ins from 4pm - 7:30pm) °Thursday 8 am - 5 pm (starting 07/15/18 we will be open late and accepting walk-ins from 4pm - 7:30pm) °Friday 8 am - 5 pm ° °For an appointment please call the [facility] at [phone] ° °For urgent needs, [facility] is also available for management of urgent GYN complaints such as vaginal discharge or urinary tract infections. ° ° ° ° ° ° °Bacterial Vaginosis °Bacterial vaginosis is a vaginal infection that occurs when the normal balance of bacteria in the vagina is disrupted. It results from an overgrowth of certain bacteria. This is the most common vaginal infection among women ages 15-44. °Because bacterial vaginosis increases your risk for STIs (sexually transmitted infections), getting treated can help reduce your risk for chlamydia, gonorrhea, herpes, and HIV (human immunodeficiency virus). Treatment is also important for preventing complications in pregnant women, because this condition can cause an early  (premature) delivery. °What are the causes? °This condition is caused by an increase in harmful bacteria that are normally present in small amounts in the vagina. However, the reason that the condition develops is not fully understood. °What increases the risk? °The following factors may make you more likely to develop this condition: °· Having a new sexual partner or multiple sexual partners. °· Having unprotected sex. °· Douching. °· Having an intrauterine device (IUD). °· Smoking. °· Drug and alcohol abuse. °· Taking certain antibiotic medicines. °· Being pregnant. ° °You cannot get bacterial vaginosis from toilet seats, bedding, swimming pools, or contact with objects around you. °What are the signs or symptoms? °Symptoms of this condition include: °· Grey or white vaginal discharge. The discharge can also be watery or foamy. °· A fish-like odor with discharge, especially after sexual intercourse or during menstruation. °· Itching in and around the vagina. °· Burning or pain with urination. ° °Some women with bacterial vaginosis have no signs or symptoms. °How is this diagnosed? °This condition is diagnosed based on: °· Your medical history. °· A physical exam of the vagina. °· Testing a sample of vaginal fluid under a microscope to look for a large amount of bad bacteria or abnormal cells. Your health care provider may use a cotton swab or a small wooden spatula to collect the sample. ° °How is this treated? °This condition is treated with antibiotics. These may be given as a pill, a vaginal cream, or a medicine that is put into the vagina (suppository). If the condition comes back after treatment, a second round of antibiotics may   be needed. °Follow these instructions at home: °Medicines °· Take over-the-counter and prescription medicines only as told by your health care provider. °· Take or use your antibiotic as told by your health care provider. Do not stop taking or using the antibiotic even if you start  to feel better. °General instructions °· If you have a female sexual partner, tell her that you have a vaginal infection. She should see her health care provider and be treated if she has symptoms. If you have a female sexual partner, he does not need treatment. °· During treatment: °? Avoid sexual activity until you finish treatment. °? Do not douche. °? Avoid alcohol as directed by your health care provider. °? Avoid breastfeeding as directed by your health care provider. °· Drink enough water and fluids to keep your urine clear or pale yellow. °· Keep the area around your vagina and rectum clean. °? Wash the area daily with warm water. °? Wipe yourself from front to back after using the toilet. °· Keep all follow-up visits as told by your health care provider. This is important. °How is this prevented? °· Do not douche. °· Wash the outside of your vagina with warm water only. °· Use protection when having sex. This includes latex condoms and dental dams. °· Limit how many sexual partners you have. To help prevent bacterial vaginosis, it is best to have sex with just one partner (monogamous). °· Make sure you and your sexual partner are tested for STIs. °· Wear cotton or cotton-lined underwear. °· Avoid wearing tight pants and pantyhose, especially during summer. °· Limit the amount of alcohol that you drink. °· Do not use any products that contain nicotine or tobacco, such as cigarettes and e-cigarettes. If you need help quitting, ask your health care provider. °· Do not use illegal drugs. °Where to find more information: °· Centers for Disease Control and Prevention: www.cdc.gov/std °· American Sexual Health Association (ASHA): www.ashastd.org °· U.S. Department of Health and Human Services, Office on Women's Health: www.womenshealth.gov/ or https://www.womenshealth.gov/a-z-topics/bacterial-vaginosis °Contact a health care provider if: °· Your symptoms do not improve, even after treatment. °· You have more  discharge or pain when urinating. °· You have a fever. °· You have pain in your abdomen. °· You have pain during sex. °· You have vaginal bleeding between periods. °Summary °· Bacterial vaginosis is a vaginal infection that occurs when the normal balance of bacteria in the vagina is disrupted. °· Because bacterial vaginosis increases your risk for STIs (sexually transmitted infections), getting treated can help reduce your risk for chlamydia, gonorrhea, herpes, and HIV (human immunodeficiency virus). Treatment is also important for preventing complications in pregnant women, because the condition can cause an early (premature) delivery. °· This condition is treated with antibiotic medicines. These may be given as a pill, a vaginal cream, or a medicine that is put into the vagina (suppository). °This information is not intended to replace advice given to you by your health care provider. Make sure you discuss any questions you have with your health care provider. °Document Released: 09/29/2005 Document Revised: 02/02/2017 Document Reviewed: 06/14/2016 °Elsevier Interactive Patient Education © 2018 [employer] ° °

## 2017-11-16 NOTE — MAU Note (Signed)
Patient c/o  Past few weeks +lower abdominal and back pain Cramping in nature Constant Pain rating 8/10 Has tried tylenol at 8 am for pain with slight relief  +vaginal odor Vaginal bleeding currently for past week; has Nexplanon since september
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
## 2017-11-17 LAB — HIV ANTIBODY (ROUTINE TESTING W REFLEX): HIV SCREEN 4TH GENERATION: NONREACTIVE

## 2017-11-17 LAB — RPR: RPR: NONREACTIVE

## 2017-11-17 LAB — GC/CHLAMYDIA PROBE AMP (~~LOC~~) NOT AT ARMC
Chlamydia: NEGATIVE
Neisseria Gonorrhea: NEGATIVE

## 2017-12-14 ENCOUNTER — Encounter: Payer: Self-pay | Admitting: *Deleted

## 2020-05-30 ENCOUNTER — Ambulatory Visit: Payer: Self-pay

## 2020-06-01 ENCOUNTER — Ambulatory Visit: Payer: Medicaid Other

## 2022-07-17 ENCOUNTER — Encounter (HOSPITAL_COMMUNITY): Payer: Self-pay

## 2022-07-17 ENCOUNTER — Emergency Department (HOSPITAL_COMMUNITY): Payer: Medicaid Other

## 2022-07-17 ENCOUNTER — Emergency Department (HOSPITAL_COMMUNITY)
Admission: EM | Admit: 2022-07-17 | Discharge: 2022-07-17 | Discharge status: Against Medical Advice | Payer: Medicaid Other | Attending: Emergency Medicine | Admitting: Emergency Medicine

## 2022-07-17 DIAGNOSIS — R0789 Other chest pain: Secondary | ICD-10-CM | POA: Diagnosis present

## 2022-07-17 DIAGNOSIS — Z5321 Procedure and treatment not carried out due to patient leaving prior to being seen by health care provider: Secondary | ICD-10-CM | POA: Diagnosis not present

## 2022-07-17 LAB — CBC
HCT: 35.7 % — ABNORMAL LOW (ref 36.0–46.0)
Hemoglobin: 11.8 g/dL — ABNORMAL LOW (ref 12.0–15.0)
MCH: 31.9 pg (ref 26.0–34.0)
MCHC: 33.1 g/dL (ref 30.0–36.0)
MCV: 96.5 fL (ref 80.0–100.0)
Platelets: 151 10*3/uL (ref 150–400)
RBC: 3.7 MIL/uL — ABNORMAL LOW (ref 3.87–5.11)
RDW: 13.3 % (ref 11.5–15.5)
WBC: 5.7 10*3/uL (ref 4.0–10.5)
nRBC: 0 % (ref 0.0–0.2)

## 2022-07-17 LAB — BASIC METABOLIC PANEL
Anion gap: 6 (ref 5–15)
BUN: 18 mg/dL (ref 6–20)
CO2: 24 mmol/L (ref 22–32)
Calcium: 8.9 mg/dL (ref 8.9–10.3)
Chloride: 107 mmol/L (ref 98–111)
Creatinine, Ser: 0.78 mg/dL (ref 0.44–1.00)
GFR, Estimated: >60 mL/min (ref >60)
Glucose, Bld: 105 mg/dL — ABNORMAL HIGH (ref 70–99)
Potassium: 3.5 mmol/L (ref 3.5–5.1)
Sodium: 137 mmol/L (ref 135–145)

## 2022-07-17 LAB — I-STAT BETA HCG BLOOD, ED (MC, WL, AP ONLY): I-stat hCG, quantitative: <5 m[IU]/mL (ref <5)

## 2022-07-17 LAB — TROPONIN I (HIGH SENSITIVITY): Troponin I (High Sensitivity): 2 ng/L (ref <18)

## 2022-07-17 NOTE — ED Provider Triage Note (Signed)
Emergency Medicine Provider Triage Evaluation Note  SAMREEN SELTZER , a 36 y.o. female  was evaluated in triage.  Pt complains of chest tightness.  Reports that it started earlier today.  She took an ibuprofen which helped her but then it started to come back.  Says is worse when she takes a deep breath.  No cough, previous DVT, recent travel, estrogen product use, palpitations or shortness of breath  Review of Systems  Positive: As above Negative: As above  Physical Exam  BP 139/88 (BP Location: Left Arm)   Pulse 68   Temp 98.5 F (36.9 C)   Resp 18   Ht 5\' 4"  (1.626 m)   Wt 59 kg   LMP 07/04/2022 (Approximate)   SpO2 100%   BMI 22.31 kg/m  Gen:   Awake, no distress   Resp:  Normal effort  MSK:   Moves extremities without difficulty  Other:  Chest pain not reproducible.  RRR, lung sounds clear and no lower extremity swelling  Medical Decision Making  Medically screening exam initiated at 3:20 PM.  Appropriate orders placed.  JAIELLE DLOUHY was informed that the remainder of the evaluation will be completed by another provider, this initial triage assessment does not replace that evaluation, and the importance of remaining in the ED until their evaluation is complete.     Rhae Hammock, PA-C 07/17/22 1525

## 2022-07-17 NOTE — ED Triage Notes (Signed)
Pt presents with c/o chest pain that started this morning. Pt reports the pain is in the center of her chest and feels like tightness in nature.

## 2024-01-26 ENCOUNTER — Other Ambulatory Visit: Payer: Self-pay

## 2024-01-26 ENCOUNTER — Encounter (HOSPITAL_COMMUNITY): Payer: Self-pay | Admitting: Emergency Medicine

## 2024-01-26 ENCOUNTER — Ambulatory Visit (HOSPITAL_COMMUNITY)
Admission: EM | Admit: 2024-01-26 | Discharge: 2024-01-26 | Disposition: A | Discharge status: Home / Self Care | Attending: Physician Assistant | Admitting: Physician Assistant

## 2024-01-26 DIAGNOSIS — K611 Rectal abscess: Secondary | ICD-10-CM

## 2024-01-26 MED ORDER — SULFAMETHOXAZOLE-TRIMETHOPRIM 800-160 MG PO TABS: 1.0000 | ORAL_TABLET | Freq: Two times a day (BID) | ORAL | 0 refills | Status: AC
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
## 2024-01-26 NOTE — ED Triage Notes (Signed)
 Pt states she is having a boil on her buttocks for few days getting more painful.

## 2024-01-26 NOTE — Discharge Instructions (Addendum)
 You were seen today for concerns for an abscess in the perianal area.  At this time I do not think that incision and drainage will provide significant relief for your symptoms so I am prescribing an antibiotic for you to take to help relieve this.  I have sent in a medication called Bactrim for you to take twice per day for 7 days.  Please take and finish the entire course as directed unless you develop an allergic reaction or if a provider tells you to stop. You can alternate Tylenol and ibuprofen as needed for pain management.  I also recommend using warm compresses or sitz bath, which hazel pads as needed to help with your discomfort. If at any point you start to develop fevers, chills, changes to your bowel habits, significant bleeding or drainage that looks like pus, severe swelling please go to the emergency room as these could be signs of a medical emergency.
